# Patient Record
Sex: Male | Born: 1945 | Race: White | Hispanic: No | Marital: Married | Smoking: Current every day smoker
Health system: Southern US, Community
[De-identification: ages and names within clinical notes are randomized; demographics above are authoritative.]

## PROBLEM LIST (undated history)

## (undated) DIAGNOSIS — I10 Essential (primary) hypertension: Secondary | ICD-10-CM

## (undated) DIAGNOSIS — G4730 Sleep apnea, unspecified: Secondary | ICD-10-CM

## (undated) DIAGNOSIS — M109 Gout, unspecified: Secondary | ICD-10-CM

## (undated) DIAGNOSIS — G5790 Unspecified mononeuropathy of unspecified lower limb: Secondary | ICD-10-CM

## (undated) DIAGNOSIS — I82409 Acute embolism and thrombosis of unspecified deep veins of unspecified lower extremity: Secondary | ICD-10-CM

## (undated) DIAGNOSIS — R600 Localized edema: Secondary | ICD-10-CM

## (undated) DIAGNOSIS — R0600 Dyspnea, unspecified: Secondary | ICD-10-CM

## (undated) DIAGNOSIS — E119 Type 2 diabetes mellitus without complications: Secondary | ICD-10-CM

## (undated) DIAGNOSIS — M1990 Unspecified osteoarthritis, unspecified site: Secondary | ICD-10-CM

---
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
## 2003-12-11 HISTORY — PX: ANKLE FRACTURE SURGERY: SHX122

## 2008-07-16 ENCOUNTER — Ambulatory Visit: Payer: Self-pay | Admitting: Internal Medicine

## 2008-08-10 ENCOUNTER — Ambulatory Visit: Payer: Self-pay | Admitting: Internal Medicine

## 2008-11-11 ENCOUNTER — Ambulatory Visit: Payer: Self-pay | Admitting: Internal Medicine

## 2010-02-07 ENCOUNTER — Ambulatory Visit: Payer: Self-pay | Admitting: Gastroenterology

## 2015-07-06 DIAGNOSIS — F17200 Nicotine dependence, unspecified, uncomplicated: Secondary | ICD-10-CM | POA: Insufficient documentation

## 2016-01-05 DIAGNOSIS — I10 Essential (primary) hypertension: Secondary | ICD-10-CM | POA: Insufficient documentation

## 2016-04-16 ENCOUNTER — Ambulatory Visit
Admission: RE | Admit: 2016-04-16 | Discharge: 2016-04-16 | Disposition: A | Discharge status: Home / Self Care | Payer: Medicare Other | Source: Ambulatory Visit | Attending: Internal Medicine | Admitting: Internal Medicine

## 2016-04-16 ENCOUNTER — Other Ambulatory Visit: Payer: Self-pay | Admitting: Internal Medicine

## 2016-04-16 DIAGNOSIS — M79662 Pain in left lower leg: Secondary | ICD-10-CM | POA: Diagnosis present

## 2016-04-16 DIAGNOSIS — I82412 Acute embolism and thrombosis of left femoral vein: Secondary | ICD-10-CM | POA: Diagnosis not present

## 2016-04-16 DIAGNOSIS — M7989 Other specified soft tissue disorders: Secondary | ICD-10-CM | POA: Diagnosis present

## 2016-04-16 DIAGNOSIS — I82432 Acute embolism and thrombosis of left popliteal vein: Secondary | ICD-10-CM | POA: Diagnosis not present

## 2017-05-28 DIAGNOSIS — Z8739 Personal history of other diseases of the musculoskeletal system and connective tissue: Secondary | ICD-10-CM | POA: Insufficient documentation

## 2017-06-13 ENCOUNTER — Other Ambulatory Visit: Payer: Self-pay | Admitting: Internal Medicine

## 2017-06-13 ENCOUNTER — Ambulatory Visit
Admission: RE | Admit: 2017-06-13 | Discharge: 2017-06-13 | Disposition: A | Discharge status: Home / Self Care | Payer: Medicare Other | Source: Ambulatory Visit | Attending: Internal Medicine | Admitting: Internal Medicine

## 2017-06-13 DIAGNOSIS — F17200 Nicotine dependence, unspecified, uncomplicated: Secondary | ICD-10-CM

## 2017-06-13 DIAGNOSIS — E119 Type 2 diabetes mellitus without complications: Secondary | ICD-10-CM | POA: Diagnosis present

## 2017-06-13 DIAGNOSIS — I82412 Acute embolism and thrombosis of left femoral vein: Secondary | ICD-10-CM | POA: Diagnosis not present

## 2017-06-13 DIAGNOSIS — M7989 Other specified soft tissue disorders: Secondary | ICD-10-CM | POA: Diagnosis present

## 2018-04-01 DIAGNOSIS — Z86718 Personal history of other venous thrombosis and embolism: Secondary | ICD-10-CM | POA: Insufficient documentation

## 2018-10-29 NOTE — Progress Notes (Signed)
10/30/2018  11:31 AM   Derrick Morales May 03, 1946 [id number]  Referring provider: Tracie Harrier, MD [address] [facility] [place], [place] [address]  Chief Complaint  Patient presents with  . Hematuria    New Patient    HPI: Derrick Morales is a 72 yo M who presents today for management and evaluation of microscopic hematuria.  He was referred to [place] by Tracie Harrier, MD.   Routine UA showed multiple incedences of microscopic blood in urine, 4-10 RBC on 4/19 and 10/19 with the absence of infection. Also seen on occasion, 2017.   Pt denies gross hematuria, kidney stones, flank pain, and reports of being a current smoker with about 1 pack a day for 50 years. Pt is on chronic Xarelto.   Pt also has a hard time starting a urinating stream in the morning which has been going on for about a year. He denies any other significant voiding symptoms.  This has become increasingly bothersome to him.  PSA 0.39 from 4/19.   PMH: No past medical history on file.  Surgical History: Past Surgical History:  Procedure Laterality Date  . ANKLE FRACTURE SURGERY  2005  . Testicular Torsion Repair Left 1963    Home Medications:  Allergies as of 10/30/2018   No Known Allergies     Medication List        Accurate as of 10/30/18 11:31 AM. Always use your most recent med list.          allopurinol 100 MG tablet Commonly known as:  ZYLOPRIM   furosemide 20 MG tablet Commonly known as:  LASIX   hydrochlorothiazide 25 MG tablet Commonly known as:  HYDRODIURIL   lisinopril 20 MG tablet Commonly known as:  PRINIVIL,ZESTRIL   lovastatin 20 MG tablet Commonly known as:  MEVACOR   metFORMIN 500 MG tablet Commonly known as:  GLUCOPHAGE TAKE 1 TABLET TWICE DAILY   XARELTO 20 MG Tabs tablet Generic drug:  rivaroxaban       Allergies: No Known Allergies  Family History: Family History  Problem Relation Age of Onset  . Kidney cancer Neg Hx   .  Prostate cancer Neg Hx     Social History:  reports that he has been smoking cigarettes. He has been smoking about 1.00 pack per day. He has never used smokeless tobacco. He reports that he drinks alcohol. He reports that he does not use drugs.  ROS: UROLOGY Frequent Urination?: Yes Hard to postpone urination?: No Burning/pain with urination?: No Get up at night to urinate?: Yes Leakage of urine?: No Urine stream starts and stops?: No Trouble starting stream?: Yes Do you have to strain to urinate?: No Blood in urine?: Yes Urinary tract infection?: No Sexually transmitted disease?: No Injury to kidneys or bladder?: No Painful intercourse?: No Weak stream?: Yes Erection problems?: No Penile pain?: No  Gastrointestinal Nausea?: No Vomiting?: No Indigestion/heartburn?: No Diarrhea?: No Constipation?: No  Constitutional Fever: No Night sweats?: No Weight loss?: No Fatigue?: No  Skin Skin rash/lesions?: No Itching?: Yes  Eyes Blurred vision?: Yes Double vision?: Yes  Ears/Nose/Throat Sore throat?: No Sinus problems?: No  Hematologic/Lymphatic Swollen glands?: No Easy bruising?: Yes  Cardiovascular Leg swelling?: Yes Chest pain?: No  Respiratory Cough?: No Shortness of breath?: No  Endocrine Excessive thirst?: No  Musculoskeletal Back pain?: Yes Joint pain?: No  Neurological Headaches?: No Dizziness?: No  Psychologic Depression?: No Anxiety?: No  Physical Exam: BP 110/70   Pulse (!) 101   Ht  5\' 8"  (1.727 m)   Wt 225 lb (102.1 kg)   BMI 34.21 kg/m   Constitutional:  Alert and oriented, No acute distress. HEENT: [place] AT, moist mucus membranes.  Trachea midline, no masses. Cardiovascular: No clubbing, cyanosis, or edema. Respiratory: Normal respiratory effort, no increased work of breathing. GI: Abdomen is soft, nontender, nondistended, no abdominal masses, protuberant abdomnen GU: No CVA tenderness Skin: No rashes, bruises or suspicious  lesions. Neurologic: Grossly intact, no focal deficits, moving all 4 extremities. Psychiatric: Normal mood and affect.  Laboratory Data: Cr 1.2 09/2018 PSA as above  Urinalysis UA negative, see care every for further previous UA Reviewed today  Pertinent Imaging: n/a  Assessment & Plan:   1. Microscopic Hematuria We discussed the differential diagnosis for microscopic hematuria including nephrolithiasis, renal or upper tract tumors, bladder stones, UTIs, or bladder tumors as well as undetermined etiologies. Per AUA guidelines, I did recommend complete microscopic hematuria evaluation including CTU, possible urine cytology, and office cystoscopy. Return in about 4 weeks for CT scan/ f/u CT scan  2. BPH with urinary hesitancy  Trial of Flomax  Discussed possible side effects Reevaluate symptoms at follow   Return in about 4 weeks (around 11/27/2018) for CT scan/ f/u Ct scan.  Hollice Espy, MD  [facility] [address] [place], [place] [address] [phone]  I, Lucas Mallow, am acting as a scribe for Dr. Hollice Espy,  I have reviewed the above documentation for accuracy and completeness, and I agree with the above.   Hollice Espy, MD

## 2018-10-30 ENCOUNTER — Ambulatory Visit (INDEPENDENT_AMBULATORY_CARE_PROVIDER_SITE_OTHER): Payer: Medicare Other | Admitting: Urology

## 2018-10-30 ENCOUNTER — Encounter: Payer: Self-pay | Admitting: Urology

## 2018-10-30 VITALS — BP 110/70 | HR 101 | Ht 68.0 in | Wt 225.0 lb

## 2018-10-30 DIAGNOSIS — N401 Enlarged prostate with lower urinary tract symptoms: Secondary | ICD-10-CM

## 2018-10-30 DIAGNOSIS — N138 Other obstructive and reflux uropathy: Secondary | ICD-10-CM

## 2018-10-30 DIAGNOSIS — G4730 Sleep apnea, unspecified: Secondary | ICD-10-CM | POA: Insufficient documentation

## 2018-10-30 DIAGNOSIS — R3129 Other microscopic hematuria: Secondary | ICD-10-CM | POA: Diagnosis not present

## 2018-10-30 DIAGNOSIS — R3911 Hesitancy of micturition: Secondary | ICD-10-CM | POA: Diagnosis not present

## 2018-10-30 DIAGNOSIS — E785 Hyperlipidemia, unspecified: Secondary | ICD-10-CM | POA: Insufficient documentation

## 2018-10-30 DIAGNOSIS — E119 Type 2 diabetes mellitus without complications: Secondary | ICD-10-CM | POA: Insufficient documentation

## 2018-10-30 LAB — URINALYSIS, COMPLETE
BILIRUBIN UA: NEGATIVE
GLUCOSE, UA: NEGATIVE
Ketones, UA: NEGATIVE
LEUKOCYTES UA: NEGATIVE
Nitrite, UA: NEGATIVE
PROTEIN UA: NEGATIVE
Specific Gravity, UA: 1.015 (ref 1.005–1.030)
UUROB: 0.2 mg/dL (ref 0.2–1.0)
pH, UA: 6 (ref 5.0–7.5)

## 2018-10-30 LAB — MICROSCOPIC EXAMINATION
EPITHELIAL CELLS (NON RENAL): NONE SEEN /HPF (ref 0–10)
WBC UA: NONE SEEN /HPF (ref 0–5)

## 2018-11-14 ENCOUNTER — Ambulatory Visit: Payer: Medicare Other

## 2018-11-19 ENCOUNTER — Ambulatory Visit
Admission: RE | Admit: 2018-11-19 | Discharge: 2018-11-19 | Disposition: A | Discharge status: Home / Self Care | Payer: Medicare Other | Source: Ambulatory Visit | Attending: Urology | Admitting: Urology

## 2018-11-19 DIAGNOSIS — R3129 Other microscopic hematuria: Secondary | ICD-10-CM | POA: Diagnosis not present

## 2018-11-19 HISTORY — DX: Essential (primary) hypertension: I10

## 2018-11-19 LAB — POCT I-STAT CREATININE: Creatinine, Ser: 1.1 mg/dL (ref 0.61–1.24)

## 2018-11-19 MED ORDER — IOPAMIDOL (ISOVUE-300) INJECTION 61%
125.0000 mL | Freq: Once | INTRAVENOUS | Status: AC | PRN
  Administered 2018-11-19: 125 mL via INTRAVENOUS

## 2018-11-19 NOTE — Progress Notes (Signed)
   11/20/2018  CC:  Chief Complaint  Patient presents with  . Results    HPI: Derrick Morales is a 72 yo M who presents today for a cystoscopy for the management and evaluation of microscopic hematuria.   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    Blood pressure 128/76, pulse 84, weight 223 lb (101.2 kg). NED. A&Ox3.   No respiratory distress   Abd soft, NT, ND Normal phallus with bilateral descended testicles  Cystoscopy Procedure Note  Patient identification was confirmed, informed consent was obtained, and patient was prepped using Betadine solution.  Lidocaine jelly was administered per urethral meatus.    Pre-Procedure: - Inspection reveals a normal caliber ureteral meatus.  Procedure: The flexible cystoscope was introduced without difficulty - No urethral strictures/lesions are present. - prostate is mildly enlarge  - Normal bladder neck - Bilateral ureteral orifices identified - Bladder mucosa  reveals no ulcers, tumors, or lesions - No bladder stones - No trabeculation -Superficial 4-5 small tumors measuring 0.14mm each round and papillary in nature in erythematous right posterior bladder wall   Retroflexion is unremarkable.   Post-Procedure: - Patient tolerated the procedure well  Assessment/ Plan: 1. Bladder Tumor  Cystoscopy showed superficial 4-5 small tumors measuring 0.49mm each round and papillary in nature in erythematous right posterior bladder wall  Findings reviewed with the patient Recommend proceeding to the operating room for cystoscopy, TURBT and instillation of intravesical chemotherapy Risk and benefits were discussed in detail including risk of bleeding, infection, damage surrounding structures amongst others.  All questions answered.  2. Microscopic Hematuria CT shows no renal calculi or focal urinary tract lesion  Cysto as above  3. 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 BPH with urinary hesitancy  Pt did not obtain prescription for Flomax; Flomax was re-prescribed today  Discussed possible side effects Reevaluate symptoms at follow up   Dolores Frame, am acting as a scribe for Dr. Hollice Espy,  I have reviewed the above documentation for accuracy and completeness, and I agree with the above.   Hollice Espy, MD

## 2018-11-20 ENCOUNTER — Telehealth: Payer: Self-pay | Admitting: Radiology

## 2018-11-20 ENCOUNTER — Other Ambulatory Visit: Payer: Self-pay | Admitting: Radiology

## 2018-11-20 ENCOUNTER — Ambulatory Visit (INDEPENDENT_AMBULATORY_CARE_PROVIDER_SITE_OTHER): Payer: Medicare Other | Admitting: Urology

## 2018-11-20 ENCOUNTER — Other Ambulatory Visit: Payer: Self-pay

## 2018-11-20 ENCOUNTER — Encounter: Payer: Self-pay | Admitting: Urology

## 2018-11-20 VITALS — BP 128/76 | HR 84 | Wt 223.0 lb

## 2018-11-20 DIAGNOSIS — N138 Other obstructive and reflux uropathy: Secondary | ICD-10-CM

## 2018-11-20 DIAGNOSIS — R3129 Other microscopic hematuria: Secondary | ICD-10-CM | POA: Diagnosis not present

## 2018-11-20 DIAGNOSIS — N401 Enlarged prostate with lower urinary tract symptoms: Secondary | ICD-10-CM

## 2018-11-20 DIAGNOSIS — D494 Neoplasm of unspecified behavior of bladder: Secondary | ICD-10-CM

## 2018-11-20 LAB — URINALYSIS, COMPLETE
BILIRUBIN UA: NEGATIVE
Glucose, UA: NEGATIVE
Ketones, UA: NEGATIVE
Leukocytes, UA: NEGATIVE
Nitrite, UA: NEGATIVE
Protein, UA: NEGATIVE
Specific Gravity, UA: 1.025 (ref 1.005–1.030)
Urobilinogen, Ur: 0.2 mg/dL (ref 0.2–1.0)
pH, UA: 5.5 (ref 5.0–7.5)

## 2018-11-20 LAB — MICROSCOPIC EXAMINATION
Epithelial Cells (non renal): NONE SEEN /hpf (ref 0–10)
WBC, UA: NONE SEEN /hpf (ref 0–5)

## 2018-11-20 MED ORDER — SODIUM CHLORIDE 0.9 % IR SOLN: 2000.0000 mg | Freq: Once | Status: DC

## 2018-11-20 MED ORDER — TAMSULOSIN HCL 0.4 MG PO CAPS: 0.4000 mg | ORAL_CAPSULE | Freq: Every day | ORAL | 11 refills | Status: DC

## 2018-11-20 NOTE — Telephone Encounter (Signed)
Patient was given the [facility] Information form below as well as the Instructions for Pre-Admission Testing form & a map of [facility].   [place], [place], [place] [address] Telephone: [phone] Fax: [phone]   Thank you for choosing [facility] for your upcoming surgery!  We are always here to assist in your urological needs.  Please read the following information with specific details for your upcoming appointments related to your surgery. Please contact Charese Abundis at [phone] Option 3 with any questions.  The Name of Your Surgery: transurethral resection of bladder tumor Your Surgery Date: 12/22/2018 Your Surgeon: Hollice Espy  Please call Same Day Surgery at [phone] between the hours of 1pm-3pm one day prior to your surgery. They will inform you of the time to arrive at Same Day Surgery which is located on the second floor of the [facility]   Please refer to the attached letter regarding instructions for Pre-Admission Testing. You will receive a call from the [place] office regarding your appointment with them.  The Pre-Admission Testing office is located at [place], on the first floor of the [place] [facility] in [place] (office is to the right as you enter through the [employer] of the [facility]). Please have all medications you are currently taking and your insurance card available.   Patient was advised to have nothing to eat or drink after midnight the night prior to surgery except that he may have only water until 2 hours before surgery with nothing to drink within 2 hours of surgery.  The patient states he currently takes Xarelto & was informed that he will be contacted once clearance has been obtained from Dr Ginette Pitman to stop medication prior to surgery. Patient's questions were  answered and he expressed understanding of these instructions.

## 2018-11-21 ENCOUNTER — Other Ambulatory Visit: Payer: Self-pay | Admitting: Family Medicine

## 2018-11-21 MED ORDER — TAMSULOSIN HCL 0.4 MG PO CAPS: 0.4000 mg | ORAL_CAPSULE | Freq: Every day | ORAL | 11 refills | Status: DC

## 2018-11-23 LAB — CULTURE, URINE COMPREHENSIVE

## 2018-11-24 NOTE — Telephone Encounter (Signed)
Advised patient to hold Xarelto x 3 days prior to surgery, beginning on 12/19/2018 per clearance obtained from Dr Ginette Pitman on 11/21/2018. Questions answered. Patient expresses understanding of conversation.

## 2018-11-26 ENCOUNTER — Encounter: Payer: Self-pay | Admitting: Urology

## 2018-11-28 ENCOUNTER — Telehealth: Payer: Self-pay | Admitting: Urology

## 2018-11-28 NOTE — Telephone Encounter (Signed)
Lowella Dandy from [facility] called about pt's Tamsulosin RX requested on 11/20/18.    Please call [phone] or Fax# [phone]

## 2018-11-28 NOTE — Telephone Encounter (Signed)
Spoke to [employer], RX was sent for 90 with 3 refills

## 2018-12-08 ENCOUNTER — Other Ambulatory Visit: Payer: Medicare Other

## 2018-12-15 ENCOUNTER — Encounter
Admission: RE | Admit: 2018-12-15 | Discharge: 2018-12-15 | Disposition: A | Discharge status: Home / Self Care | Payer: Medicare Other | Source: Ambulatory Visit | Attending: Urology | Admitting: Urology

## 2018-12-15 ENCOUNTER — Other Ambulatory Visit: Payer: Self-pay

## 2018-12-15 DIAGNOSIS — Z01818 Encounter for other preprocedural examination: Secondary | ICD-10-CM | POA: Insufficient documentation

## 2018-12-15 HISTORY — DX: Gout, unspecified: M10.9

## 2018-12-15 HISTORY — DX: Localized edema: R60.0

## 2018-12-15 HISTORY — DX: Type 2 diabetes mellitus without complications: E11.9

## 2018-12-15 LAB — URINALYSIS, ROUTINE W REFLEX MICROSCOPIC
Bacteria, UA: NONE SEEN
Bilirubin Urine: NEGATIVE
Glucose, UA: NEGATIVE mg/dL
Hgb urine dipstick: NEGATIVE
Ketones, ur: NEGATIVE mg/dL
Leukocytes, UA: NEGATIVE
Nitrite: NEGATIVE
PH: 7 (ref 5.0–8.0)
Protein, ur: NEGATIVE mg/dL
SQUAMOUS EPITHELIAL / LPF: NONE SEEN (ref 0–5)
Specific Gravity, Urine: 1.01 (ref 1.005–1.030)

## 2018-12-15 NOTE — Pre-Procedure Instructions (Signed)
EKG OK BY DR AMY PENWARDEN

## 2018-12-15 NOTE — Patient Instructions (Signed)
Your procedure is scheduled on: 12/22/17 Mon Report to Same Day Surgery 2nd floor medical mall [facility] Entrance-take elevator on left to 2nd floor.  Check in with surgery information desk.) To find out your arrival time please call [phone] between 1PM - 3PM on 12/19/17 Fri  Remember: Instructions that are not followed completely may result in serious medical risk, up to and including death, or upon the discretion of your surgeon and anesthesiologist your surgery may need to be rescheduled.    _x___ 1. Do not eat food after midnight the night before your procedure. You may drink clear liquids up to 2 hours before you are scheduled to arrive at the hospital for your procedure.  Do not drink clear liquids within 2 hours of your scheduled arrival to the hospital.  Clear liquids include  --Water or Apple juice without pulp  --Clear carbohydrate beverage such as ClearFast or Gatorade  --Black Coffee or Clear Tea (No milk, no creamers, do not add anything to                  the coffee or Tea Type 1 and type 2 diabetics should only drink water.   ____Ensure clear carbohydrate drink on the way to the hospital for bariatric patients  ____Ensure clear carbohydrate drink 3 hours before surgery for Dr Dwyane Luo patients if physician instructed.   No gum chewing or hard candies.     __x__ 2. No Alcohol for 24 hours before or after surgery.   __x__3. No Smoking or e-cigarettes for 24 prior to surgery.  Do not use any chewable tobacco products for at least 6 hour prior to surgery   ____  4. Bring all medications with you on the day of surgery if instructed.    __x__ 5. Notify your doctor if there is any change in your medical condition     (cold, fever, infections).    x___6. On the morning of surgery brush your teeth with toothpaste and water.  You may rinse your mouth with mouth wash if you wish.  Do not swallow any toothpaste or mouthwash.   Do not wear jewelry, make-up, hairpins,  clips or nail polish.  Do not wear lotions, powders, or perfumes. You may wear deodorant.  Do not shave 48 hours prior to surgery. Men may shave face and neck.  Do not bring valuables to the hospital.    [facility] is not responsible for any belongings or valuables.               Contacts, dentures or bridgework may not be worn into surgery.  Leave your suitcase in the car. After surgery it may be brought to your room.  For patients admitted to the hospital, discharge time is determined by your                       treatment team.  _  Patients discharged the day of surgery will not be allowed to drive home.  You will need someone to drive you home and stay with you the night of your procedure.    Please read over the following fact sheets that you were given:   [facility] Preparing for Surgery and or MRSA Information   _x___ Take anti-hypertensive listed below, cardiac, seizure, asthma,     anti-reflux and psychiatric medicines. These include:  1. lovastatin (MEVACOR) 20 MG tablet  2.  3.  4.  5.  6.  ____Fleets enema or  Magnesium Citrate as directed.   _x___ Use CHG Soap or sage wipes as directed on instruction sheet   ____ Use inhalers on the day of surgery and bring to hospital day of surgery  _x___ Stop Metformin and Janumet 2 days prior to surgery.    ____ Take 1/2 of usual insulin dose the night before surgery and none on the morning     surgery.   _x___ Follow recommendations from Cardiologist, Pulmonologist or PCP regarding          stopping Aspirin, Coumadin, Plavix ,Eliquis, Effient, or Pradaxa, and Pletal.  X____Stop Anti-inflammatories such as Advil, Aleve, Ibuprofen, Motrin, Naproxen, Naprosyn, Goodies powders or aspirin products. OK to take Tylenol and                          Celebrex.   _x___ Stop supplements until after surgery.  But may continue Vitamin D, Vitamin B,       and multivitamin.   ____ Bring C-Pap to the hospital.

## 2018-12-15 NOTE — Pre-Procedure Instructions (Signed)
Abnormal EKG shown to anesthesia nurse.

## 2018-12-16 LAB — URINE CULTURE: Culture: NO GROWTH

## 2018-12-17 ENCOUNTER — Encounter: Payer: Self-pay | Admitting: Urology

## 2018-12-21 MED ORDER — CEFAZOLIN SODIUM-DEXTROSE 2-4 GM/100ML-% IV SOLN
2.0000 g | INTRAVENOUS | Status: AC
  Administered 2018-12-22: 2 g via INTRAVENOUS

## 2018-12-22 ENCOUNTER — Ambulatory Visit: Payer: Medicare Other | Admitting: Anesthesiology

## 2018-12-22 ENCOUNTER — Ambulatory Visit
Admission: RE | Admit: 2018-12-22 | Discharge: 2018-12-22 | Disposition: A | Discharge status: Home / Self Care | Payer: Medicare Other | Attending: Urology | Admitting: Urology

## 2018-12-22 ENCOUNTER — Encounter
Admission: RE | Disposition: A | Discharge status: Home / Self Care | Payer: Self-pay | Source: Home / Self Care | Attending: Urology

## 2018-12-22 ENCOUNTER — Other Ambulatory Visit: Payer: Self-pay

## 2018-12-22 DIAGNOSIS — N281 Cyst of kidney, acquired: Secondary | ICD-10-CM | POA: Insufficient documentation

## 2018-12-22 DIAGNOSIS — Z79899 Other long term (current) drug therapy: Secondary | ICD-10-CM | POA: Insufficient documentation

## 2018-12-22 DIAGNOSIS — N401 Enlarged prostate with lower urinary tract symptoms: Secondary | ICD-10-CM | POA: Insufficient documentation

## 2018-12-22 DIAGNOSIS — R3911 Hesitancy of micturition: Secondary | ICD-10-CM | POA: Insufficient documentation

## 2018-12-22 DIAGNOSIS — D494 Neoplasm of unspecified behavior of bladder: Secondary | ICD-10-CM | POA: Diagnosis not present

## 2018-12-22 DIAGNOSIS — D414 Neoplasm of uncertain behavior of bladder: Secondary | ICD-10-CM | POA: Insufficient documentation

## 2018-12-22 DIAGNOSIS — R3129 Other microscopic hematuria: Secondary | ICD-10-CM | POA: Diagnosis not present

## 2018-12-22 HISTORY — PX: TRANSURETHRAL RESECTION OF BLADDER TUMOR WITH MITOMYCIN-C: SHX6459

## 2018-12-22 LAB — GLUCOSE, CAPILLARY
Glucose-Capillary: 145 mg/dL — ABNORMAL HIGH (ref 70–99)
Glucose-Capillary: 144 mg/dL — ABNORMAL HIGH (ref 70–99)

## 2018-12-22 SURGERY — TRANSURETHRAL RESECTION OF BLADDER TUMOR WITH MITOMYCIN-C
Anesthesia: General

## 2018-12-22 MED ORDER — OXYBUTYNIN CHLORIDE 5 MG PO TABS: 5.0000 mg | ORAL_TABLET | Freq: Three times a day (TID) | ORAL | 0 refills | Status: DC | PRN

## 2018-12-22 MED ORDER — GEMCITABINE CHEMO FOR BLADDER INSTILLATION 2000 MG
2000.0000 mg | Freq: Once | INTRAVENOUS | Status: AC
  Administered 2018-12-22: 2000 mg via INTRAVESICAL
  Filled 2018-12-22: qty 52.6

## 2018-12-22 MED ORDER — FENTANYL CITRATE (PF) 100 MCG/2ML IJ SOLN
INTRAMUSCULAR | Status: AC
  Filled 2018-12-22: qty 2

## 2018-12-22 MED ORDER — PHENYLEPHRINE HCL 10 MG/ML IJ SOLN
INTRAMUSCULAR | Status: DC | PRN
  Administered 2018-12-22: 100 ug via INTRAVENOUS

## 2018-12-22 MED ORDER — LACTATED RINGERS IV SOLN
INTRAVENOUS | Status: DC | PRN
  Administered 2018-12-22: 10:00:00 via INTRAVENOUS

## 2018-12-22 MED ORDER — HYDROCODONE-ACETAMINOPHEN 5-325 MG PO TABS: 1.0000 | ORAL_TABLET | Freq: Four times a day (QID) | ORAL | 0 refills | Status: AC | PRN

## 2018-12-22 MED ORDER — MIDAZOLAM HCL 2 MG/2ML IJ SOLN
INTRAMUSCULAR | Status: AC
  Filled 2018-12-22: qty 2

## 2018-12-22 MED ORDER — PHENYLEPHRINE HCL 10 MG/ML IJ SOLN
INTRAMUSCULAR | Status: AC
  Filled 2018-12-22: qty 1

## 2018-12-22 MED ORDER — FENTANYL CITRATE (PF) 100 MCG/2ML IJ SOLN: 25.0000 ug | INTRAMUSCULAR | Status: DC | PRN

## 2018-12-22 MED ORDER — PROPOFOL 10 MG/ML IV BOLUS
INTRAVENOUS | Status: DC | PRN
  Administered 2018-12-22: 140 mg via INTRAVENOUS

## 2018-12-22 MED ORDER — ONDANSETRON HCL 4 MG/2ML IJ SOLN
INTRAMUSCULAR | Status: DC | PRN
  Administered 2018-12-22: 4 mg via INTRAVENOUS

## 2018-12-22 MED ORDER — ROCURONIUM BROMIDE 50 MG/5ML IV SOLN
INTRAVENOUS | Status: AC
  Filled 2018-12-22: qty 1

## 2018-12-22 MED ORDER — LIDOCAINE HCL (CARDIAC) PF 100 MG/5ML IV SOSY
PREFILLED_SYRINGE | INTRAVENOUS | Status: DC | PRN
  Administered 2018-12-22: 80 mg via INTRAVENOUS

## 2018-12-22 MED ORDER — FENTANYL CITRATE (PF) 100 MCG/2ML IJ SOLN
INTRAMUSCULAR | Status: DC | PRN
  Administered 2018-12-22 (×2): 50 ug via INTRAVENOUS

## 2018-12-22 MED ORDER — FAMOTIDINE 20 MG PO TABS
ORAL_TABLET | ORAL | Status: AC
  Filled 2018-12-22: qty 1

## 2018-12-22 MED ORDER — PROPOFOL 10 MG/ML IV BOLUS
INTRAVENOUS | Status: AC
  Filled 2018-12-22: qty 20

## 2018-12-22 MED ORDER — OXYBUTYNIN CHLORIDE 5 MG PO TABS
ORAL_TABLET | ORAL | Status: AC
  Administered 2018-12-22: 5 mg
  Filled 2018-12-22: qty 1

## 2018-12-22 MED ORDER — FAMOTIDINE 20 MG PO TABS
ORAL_TABLET | ORAL | Status: AC
  Administered 2018-12-22: 20 mg via ORAL
  Filled 2018-12-22: qty 1

## 2018-12-22 MED ORDER — SODIUM CHLORIDE 0.9 % IV SOLN
INTRAVENOUS | Status: DC
  Administered 2018-12-22: 09:00:00 via INTRAVENOUS

## 2018-12-22 MED ORDER — FAMOTIDINE 20 MG PO TABS
20.0000 mg | ORAL_TABLET | Freq: Once | ORAL | Status: AC
  Administered 2018-12-22: 20 mg via ORAL

## 2018-12-22 MED ORDER — CEFAZOLIN SODIUM-DEXTROSE 2-4 GM/100ML-% IV SOLN
INTRAVENOUS | Status: AC
  Filled 2018-12-22: qty 100

## 2018-12-22 MED ORDER — HYDROMORPHONE HCL 1 MG/ML IJ SOLN
INTRAMUSCULAR | Status: AC
  Filled 2018-12-22: qty 1

## 2018-12-22 MED ORDER — DEXAMETHASONE SODIUM PHOSPHATE 10 MG/ML IJ SOLN
INTRAMUSCULAR | Status: DC | PRN
  Administered 2018-12-22: 5 mg via INTRAVENOUS

## 2018-12-22 MED ORDER — ONDANSETRON HCL 4 MG/2ML IJ SOLN: 4.0000 mg | Freq: Once | INTRAMUSCULAR | Status: DC | PRN

## 2018-12-22 SURGICAL SUPPLY — 27 items
BAG DRAIN CYSTO-URO LG1000N (MISCELLANEOUS) ×3 IMPLANT
BAG URINE DRAINAGE (UROLOGICAL SUPPLIES) ×3 IMPLANT
BRUSH SCRUB EZ  4% CHG (MISCELLANEOUS) ×2
BRUSH SCRUB EZ 4% CHG (MISCELLANEOUS) ×1 IMPLANT
CATH FOLEY 2WAY  5CC 16FR (CATHETERS) ×2
CATH URTH 16FR FL 2W BLN LF (CATHETERS) ×1 IMPLANT
COVER WAND RF STERILE (DRAPES) ×3 IMPLANT
CRADLE LAMINECT ARM (MISCELLANEOUS) ×3 IMPLANT
DRAPE UTILITY 15X26 TOWEL STRL (DRAPES) ×3 IMPLANT
DRSG TELFA 4X3 1S NADH ST (GAUZE/BANDAGES/DRESSINGS) ×3 IMPLANT
ELECT LOOP 22F BIPOLAR SML (ELECTROSURGICAL)
ELECT REM PT RETURN 9FT ADLT (ELECTROSURGICAL) ×3
ELECTRODE LOOP 22F BIPOLAR SML (ELECTROSURGICAL) IMPLANT
ELECTRODE REM PT RTRN 9FT ADLT (ELECTROSURGICAL) ×1 IMPLANT
GLOVE BIO SURGEON STRL SZ 6.5 (GLOVE) ×2 IMPLANT
GLOVE BIO SURGEONS STRL SZ 6.5 (GLOVE) ×1
GOWN STRL REUS W/ TWL LRG LVL3 (GOWN DISPOSABLE) ×2 IMPLANT
GOWN STRL REUS W/TWL LRG LVL3 (GOWN DISPOSABLE) ×4
KIT TURNOVER CYSTO (KITS) ×3 IMPLANT
LOOP CUT BIPOLAR 24F LRG (ELECTROSURGICAL) IMPLANT
NDL SAFETY ECLIPSE 18X1.5 (NEEDLE) ×1 IMPLANT
NEEDLE HYPO 18GX1.5 SHARP (NEEDLE) ×2
PACK CYSTO AR (MISCELLANEOUS) ×3 IMPLANT
SET IRRIG Y TYPE TUR BLADDER L (SET/KITS/TRAYS/PACK) ×3 IMPLANT
SURGILUBE 2OZ TUBE FLIPTOP (MISCELLANEOUS) ×3 IMPLANT
SYRINGE IRR TOOMEY STRL 70CC (SYRINGE) ×3 IMPLANT
WATER STERILE IRR 1000ML POUR (IV SOLUTION) ×3 IMPLANT

## 2018-12-22 NOTE — H&P (Signed)
   11/20/2018  --> updated 12/22/2017 without changes RRR CTAB  CC:     Chief Complaint  Patient presents with  . Results    HPI: Derrick Morales is a 73 yo M who presents today for a cystoscopy for the management and evaluation of microscopic hematuria.   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    Blood pressure 128/76, pulse 84, weight 223 lb (101.2 kg). NED. A&Ox3.   No respiratory distress   Abd soft, NT, ND Normal phallus with bilateral descended testicles  Cystoscopy Procedure Note  Patient identification was confirmed, informed consent was obtained, and patient was prepped using Betadine solution.  Lidocaine jelly was administered per urethral meatus.    Pre-Procedure: - Inspection reveals a normal caliber ureteral meatus.  Procedure: The flexible cystoscope was introduced without difficulty - No urethral strictures/lesions are present. - prostate is mildly enlarge  - Normal bladder neck - Bilateral ureteral orifices identified - Bladder mucosa  reveals no ulcers, tumors, or lesions - No bladder stones - No trabeculation -Superficial 4-5 small tumors measuring 0.27mm each round and papillary in nature in erythematous right posterior bladder wall   Retroflexion is unremarkable.   Post-Procedure: - Patient tolerated the procedure well  Assessment/ Plan: 1. Bladder Tumor  Cystoscopy showed superficial 4-5 small tumors measuring 0.65mm each round and papillary in nature in erythematous right posterior bladder wall  Findings reviewed with the patient Recommend proceeding to the operating room for cystoscopy, TURBT and instillation of intravesical chemotherapy Risk and benefits were discussed in detail including risk of bleeding, infection, damage surrounding structures amongst others.  All questions answered.  2. Microscopic Hematuria CT shows no renal calculi or focal urinary tract  lesion  Cysto as above  3. 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 BPH with urinary hesitancy  Pt did not obtain prescription for Flomax; Flomax was re-prescribed today  Discussed possible side effects Reevaluate symptoms at followup   Dolores Frame, am acting as a scribe for Dr. Hollice Espy,  I have reviewed the above documentation for accuracy and completeness, and I agree with the above.   Hollice Espy, MD

## 2018-12-22 NOTE — Transfer of Care (Signed)
Immediate Anesthesia Transfer of Care Note  Patient: Derrick Morales  Procedure(s) Performed: TRANSURETHRAL RESECTION OF BLADDER TUMOR WITH Gemcitabine (N/A )  Patient Location: PACU  Anesthesia Type:General  Level of Consciousness: sedated  Airway & Oxygen Therapy: Patient Spontanous Breathing and Patient connected to face mask oxygen  Post-op Assessment: Report given to RN  Post vital signs: Reviewed and stable  Last Vitals:  Vitals Value Taken Time  BP 150/75 12/22/2018 10:13 AM  Temp    Pulse 77 12/22/2018 10:16 AM  Resp 16 12/22/2018 10:16 AM  SpO2 97 % 12/22/2018 10:16 AM  Vitals shown include unvalidated device data.  Last Pain:  Vitals:   12/22/18 0839  TempSrc: Tympanic  PainSc: 0-No pain         Complications: No apparent anesthesia complications

## 2018-12-22 NOTE — Op Note (Signed)
Date of procedure: 12/22/18  Preoperative diagnosis:  1. Hematuria 2. Bladder tumors  Postoperative diagnosis:  1. Same as above  Procedure: 1. TURBT, small 2. Instillation of intravesical chemotherapy  Surgeon: Hollice Espy, MD  Anesthesia: General  Complications: None  Intraoperative findings: 5 small bladder tumors measuring approximately 5 mm each, superficial papillary in appearance on the right lateral bladder wall.  No additional tumors identified.  EBL: Minimal  Specimens: Right lateral wall bladder tumors  Drains: 16 French Foley catheter  Indication: Derrick Morales is a 73 y.o. patient with hematuria who underwent evaluation including CT urogram with normal upper tracts found to have small bladder tumors on cystoscopy in the office.  He was counseled to undergo TURBT with instillation of intravesical chemotherapy.  After reviewing the management options for treatment, he elected to proceed with the above surgical procedure(s). We have discussed the potential benefits and risks of the procedure, side effects of the proposed treatment, the likelihood of the patient achieving the goals of the procedure, and any potential problems that might occur during the procedure or recuperation. Informed consent has been obtained.  Description of procedure:  The patient was taken to the operating room and general anesthesia was induced.  The patient was placed in the dorsal lithotomy position, prepped and draped in the usual sterile fashion, and preoperative antibiotics were administered. A preoperative time-out was performed.   A 21 French scope was advanced per urethra into the bladder.  The bladder was carefully inspected and approximately 5 small tumors measuring 5 mm each were identified in the right lateral bladder wall on a mildly erythematous base.  The remainder of the bladder was unremarkable without any additional lesions identified, ulcerations, stones, or significant  trabeculation.  The trigone was also normal.  The prostatic fossa was normal.  Attention was turned to the right lateral wall using cold cup biopsy forceps, the tumors were resected and a piece wise fashion until there is no active viable residual tumors remaining.  Bugbee electrocautery was then used to fulgurate the base of the resection bed as well as the periphery around the tumor to ensure that there is no erythematous or viable tumor remaining.  Hemostasis was adequate.  The bladder was then drained.  A 16 French Foley catheter was then placed using 10 cc of water in the balloon.  The patient was then clean and dry, repositioned the supine position, first anesthesia, taken the PACU in stable condition.  2000 mg of intravesical gemcitabine was instilled into the bladder and allowed to dwell in the PACU for 1 hour.  This was well-tolerated.  At the end of the hour, the bladder was then drained and the Foley catheter was removed.  Plan: I will call the patient with his pathology report as I anticipate he will need no further intervention.  We will plan for cystoscopy for surveillance in 3 months.  All questions answered.  Hollice Espy, M.D.

## 2018-12-22 NOTE — Anesthesia Preprocedure Evaluation (Signed)
Anesthesia Evaluation  Patient identified by MRN, date of birth, ID band Patient awake    Reviewed: Allergy & Precautions, H&P , NPO status , Patient's Chart, lab work & pertinent test results, reviewed documented beta blocker date and time   Airway Mallampati: II  TM Distance: >3 FB Neck ROM: full    Dental  (+) Teeth Intact   Pulmonary neg pulmonary ROS, sleep apnea , Current Smoker,    Pulmonary exam normal        Cardiovascular Exercise Tolerance: Poor hypertension, On Medications negative cardio ROS Normal cardiovascular exam Rate:Normal     Neuro/Psych negative neurological ROS  negative psych ROS   GI/Hepatic negative GI ROS, Neg liver ROS,   Endo/Other  negative endocrine ROSdiabetes, Well Controlled  Renal/GU negative Renal ROS  negative genitourinary   Musculoskeletal   Abdominal   Peds  Hematology negative hematology ROS (+)   Anesthesia Other Findings   Reproductive/Obstetrics negative OB ROS                             Anesthesia Physical Anesthesia Plan  ASA: III  Anesthesia Plan: General LMA   Post-op Pain Management:    Induction:   PONV Risk Score and Plan:   Airway Management Planned:   Additional Equipment:   Intra-op Plan:   Post-operative Plan:   Informed Consent: I have reviewed the patients History and Physical, chart, labs and discussed the procedure including the risks, benefits and alternatives for the proposed anesthesia with the patient or authorized representative who has indicated his/her understanding and acceptance.     Plan Discussed with: CRNA  Anesthesia Plan Comments:         Anesthesia Quick Evaluation

## 2018-12-22 NOTE — Discharge Instructions (Addendum)
Transurethral Resection of Bladder Tumor (TURBT) or Bladder Biopsy ° ° °Definition: ° Transurethral Resection of the Bladder Tumor is a surgical procedure used to diagnose and remove tumors within the bladder. TURBT is the most common treatment for early stage bladder cancer. ° °General instructions: °   ° Your recent bladder surgery requires very little post hospital care but some definite precautions. ° °Despite the fact that no skin incisions were used, the area around the bladder incisions are raw and covered with scabs to promote healing and prevent bleeding. Certain precautions are needed to insure that the scabs are not disturbed over the next 2-4 weeks while the healing proceeds. ° °Because the raw surface inside your bladder and the irritating effects of urine you may expect frequency of urination and/or urgency (a stronger desire to urinate) and perhaps even getting up at night more often. This will usually resolve or improve slowly over the healing period. You may see some blood in your urine over the first 6 weeks. Do not be alarmed, even if the urine was clear for a while. Get off your feet and drink lots of fluids until clearing occurs. If you start to pass clots or don't improve call us. ° °Diet: ° °You may return to your normal diet immediately. Because of the raw surface of your bladder, alcohol, spicy foods, foods high in acid and drinks with caffeine may cause irritation or frequency and should be used in moderation. To keep your urine flowing freely and avoid constipation, drink plenty of fluids during the day (8-10 glasses). Tip: Avoid cranberry juice because it is very acidic. ° °Activity: ° °Your physical activity doesn't need to be restricted. However, if you are very active, you may see some blood in the urine. We suggest that you reduce your activity under the circumstances until the bleeding has stopped. ° °Bowels: ° °It is important to keep your bowels regular during the postoperative  period. Straining with bowel movements can cause bleeding. A bowel movement every other day is reasonable. Use a mild laxative if needed, such as milk of magnesia 2-3 tablespoons, or 2 Dulcolax tablets. Call if you continue to have problems. If you had been taking narcotics for pain, before, during or after your surgery, you may be constipated. Take a laxative if necessary. ° ° ° °Medication: ° °You should resume your pre-surgery medications unless told not to. In addition you may be given an antibiotic to prevent or treat infection. Antibiotics are not always necessary. All medication should be taken as prescribed until the bottles are finished unless you are having an unusual reaction to one of the drugs. ° ° °[facility] °[place], [place] [address] °[phone] ° ° ° °AMBULATORY SURGERY  °DISCHARGE INSTRUCTIONS ° ° °1) The drugs that you were given will stay in your system until tomorrow so for the next 24 hours you should not: ° °A) Drive an automobile °B) Make any legal decisions °C) Drink any alcoholic beverage ° ° °2) You may resume regular meals tomorrow.  Today it is better to start with liquids and gradually work up to solid foods. ° °You may eat anything you prefer, but it is better to start with liquids, then soup and crackers, and gradually work up to solid foods. ° ° °3) Please notify your doctor immediately if you have any unusual bleeding, trouble breathing, redness and pain at the surgery site, drainage, fever, or pain not relieved by medication. ° ° ° °4) Additional Instructions: ° ° ° ° ° ° ° °  Please contact your physician with any problems or Same Day Surgery at [phone], Monday through Friday 6 am to 4 pm, or [place] [facility] [place] Main number at [phone]. ° °

## 2018-12-22 NOTE — Anesthesia Procedure Notes (Signed)
Procedure Name: LMA Insertion Date/Time: 12/22/2018 9:46 AM Performed by: Justus Memory, CRNA Pre-anesthesia Checklist: Patient identified, Patient being monitored, Timeout performed, Emergency Drugs available and Suction available Patient Re-evaluated:Patient Re-evaluated prior to induction Oxygen Delivery Method: Circle system utilized Preoxygenation: Pre-oxygenation with 100% oxygen Induction Type: IV induction Ventilation: Mask ventilation without difficulty LMA: LMA inserted LMA Size: 4.5 Tube type: Oral Number of attempts: 1 Placement Confirmation: positive ETCO2 and breath sounds checked- equal and bilateral Tube secured with: Tape Dental Injury: Teeth and Oropharynx as per pre-operative assessment

## 2018-12-22 NOTE — Anesthesia Post-op Follow-up Note (Signed)
Anesthesia QCDR form completed.        

## 2018-12-23 ENCOUNTER — Telehealth: Payer: Self-pay | Admitting: Urology

## 2018-12-23 LAB — SURGICAL PATHOLOGY

## 2018-12-23 NOTE — Telephone Encounter (Signed)
LMOM to return call to discuss pathology.       Hollice Espy, MD

## 2018-12-23 NOTE — Anesthesia Postprocedure Evaluation (Signed)
Anesthesia Post Note  Patient: [name]  Procedure(s) Performed: TRANSURETHRAL RESECTION OF BLADDER TUMOR WITH Gemcitabine (N/A )  Patient location during evaluation: PACU Anesthesia Type: General Level of consciousness: awake and alert Pain management: pain level controlled Vital Signs Assessment: post-procedure vital signs reviewed and stable Respiratory status: spontaneous breathing, nonlabored ventilation, respiratory function stable and patient connected to nasal cannula oxygen Cardiovascular status: blood pressure returned to baseline and stable Postop Assessment: no apparent nausea or vomiting Anesthetic complications: no     Last Vitals:  Vitals:   12/22/18 1102 12/22/18 1149  BP: (!) 123/54 101/86  Pulse: 66 71  Resp: 18 18  Temp: 36.9 C   SpO2: 99% 98%    Last Pain:  Vitals:   12/22/18 1149  TempSrc:   PainSc: 3                  Molli Barrows

## 2018-12-24 ENCOUNTER — Encounter: Payer: Self-pay | Admitting: Urology

## 2018-12-24 NOTE — Telephone Encounter (Signed)
Pt returned your call today and would like for you to call him at your earliest convenience.

## 2018-12-26 NOTE — Telephone Encounter (Signed)
Discussed surgical pathology findings with patient, papillary neoplasm of low malignant potential.  We discussed the pathophysiology and prognosis of this.  We discussed that surveillance for the sorts of lesions is somewhat controversial however I do think it is reasonable to pursue cystoscopy annually for a few years to ensure that there is no recurrence or transformation.  He is agreeable this plan.  All questions answered.  Please reschedule his follow-up for cystoscopy in 1 year, cancel a cystoscopy in 3 months as previously scheduled.

## 2019-01-06 ENCOUNTER — Ambulatory Visit: Payer: Medicare Other | Admitting: Urology

## 2019-03-04 ENCOUNTER — Encounter: Admission: RE | Payer: Self-pay | Source: Home / Self Care

## 2019-03-04 ENCOUNTER — Ambulatory Visit: Admission: RE | Admit: 2019-03-04 | Payer: Medicare Other | Source: Home / Self Care | Admitting: Ophthalmology

## 2019-03-04 SURGERY — PHACOEMULSIFICATION, CATARACT, WITH IOL INSERTION
Anesthesia: Topical | Laterality: Right

## 2019-03-24 ENCOUNTER — Other Ambulatory Visit: Payer: Medicare Other | Admitting: Urology

## 2019-05-21 ENCOUNTER — Encounter: Payer: Self-pay | Admitting: *Deleted

## 2019-05-21 ENCOUNTER — Other Ambulatory Visit: Payer: Self-pay

## 2019-05-21 NOTE — Discharge Instructions (Signed)

## 2019-05-22 ENCOUNTER — Other Ambulatory Visit
Admission: RE | Admit: 2019-05-22 | Discharge: 2019-05-22 | Disposition: A | Discharge status: Home / Self Care | Payer: Medicare Other | Source: Ambulatory Visit | Attending: Ophthalmology | Admitting: Ophthalmology

## 2019-05-22 DIAGNOSIS — Z1159 Encounter for screening for other viral diseases: Secondary | ICD-10-CM | POA: Diagnosis not present

## 2019-05-22 DIAGNOSIS — Z01812 Encounter for preprocedural laboratory examination: Secondary | ICD-10-CM | POA: Insufficient documentation

## 2019-05-23 LAB — NOVEL CORONAVIRUS, NAA (HOSP ORDER, SEND-OUT TO REF LAB; TAT 18-24 HRS): SARS-CoV-2, NAA: NOT DETECTED

## 2019-05-26 DIAGNOSIS — H269 Unspecified cataract: Secondary | ICD-10-CM

## 2019-05-27 ENCOUNTER — Encounter
Admission: RE | Disposition: A | Discharge status: Home / Self Care | Payer: Self-pay | Source: Home / Self Care | Attending: Ophthalmology

## 2019-05-27 ENCOUNTER — Ambulatory Visit
Admission: RE | Admit: 2019-05-27 | Discharge: 2019-05-27 | Disposition: A | Discharge status: Home / Self Care | Payer: Medicare Other | Attending: Ophthalmology | Admitting: Ophthalmology

## 2019-05-27 ENCOUNTER — Other Ambulatory Visit: Payer: Self-pay

## 2019-05-27 ENCOUNTER — Ambulatory Visit: Payer: Medicare Other | Admitting: Anesthesiology

## 2019-05-27 DIAGNOSIS — E1140 Type 2 diabetes mellitus with diabetic neuropathy, unspecified: Secondary | ICD-10-CM | POA: Insufficient documentation

## 2019-05-27 DIAGNOSIS — E7800 Pure hypercholesterolemia, unspecified: Secondary | ICD-10-CM | POA: Diagnosis not present

## 2019-05-27 DIAGNOSIS — M1990 Unspecified osteoarthritis, unspecified site: Secondary | ICD-10-CM | POA: Diagnosis not present

## 2019-05-27 DIAGNOSIS — G4730 Sleep apnea, unspecified: Secondary | ICD-10-CM | POA: Insufficient documentation

## 2019-05-27 DIAGNOSIS — I10 Essential (primary) hypertension: Secondary | ICD-10-CM | POA: Insufficient documentation

## 2019-05-27 DIAGNOSIS — E1136 Type 2 diabetes mellitus with diabetic cataract: Secondary | ICD-10-CM | POA: Diagnosis not present

## 2019-05-27 DIAGNOSIS — H2511 Age-related nuclear cataract, right eye: Secondary | ICD-10-CM | POA: Diagnosis present

## 2019-05-27 DIAGNOSIS — F17200 Nicotine dependence, unspecified, uncomplicated: Secondary | ICD-10-CM | POA: Insufficient documentation

## 2019-05-27 DIAGNOSIS — H269 Unspecified cataract: Secondary | ICD-10-CM

## 2019-05-27 DIAGNOSIS — M109 Gout, unspecified: Secondary | ICD-10-CM | POA: Insufficient documentation

## 2019-05-27 DIAGNOSIS — Z86718 Personal history of other venous thrombosis and embolism: Secondary | ICD-10-CM | POA: Diagnosis not present

## 2019-05-27 DIAGNOSIS — R2243 Localized swelling, mass and lump, lower limb, bilateral: Secondary | ICD-10-CM | POA: Insufficient documentation

## 2019-05-27 HISTORY — DX: Acute embolism and thrombosis of unspecified deep veins of unspecified lower extremity: I82.409

## 2019-05-27 HISTORY — DX: Dyspnea, unspecified: R06.00

## 2019-05-27 HISTORY — PX: CATARACT EXTRACTION W/PHACO: SHX586

## 2019-05-27 HISTORY — DX: Sleep apnea, unspecified: G47.30

## 2019-05-27 HISTORY — DX: Unspecified osteoarthritis, unspecified site: M19.90

## 2019-05-27 HISTORY — DX: Unspecified mononeuropathy of unspecified lower limb: G57.90

## 2019-05-27 LAB — GLUCOSE, CAPILLARY
Glucose-Capillary: 154 mg/dL — ABNORMAL HIGH (ref 70–99)
Glucose-Capillary: 126 mg/dL — ABNORMAL HIGH (ref 70–99)

## 2019-05-27 SURGERY — PHACOEMULSIFICATION, CATARACT, WITH IOL INSERTION
Anesthesia: Monitor Anesthesia Care | Site: Eye | Laterality: Right

## 2019-05-27 MED ORDER — OXYCODONE HCL 5 MG/5ML PO SOLN: 5.0000 mg | Freq: Once | ORAL | Status: DC | PRN

## 2019-05-27 MED ORDER — MOXIFLOXACIN HCL 0.5 % OP SOLN
1.0000 [drp] | OPHTHALMIC | Status: DC | PRN
  Administered 2019-05-27 (×3): 1 [drp] via OPHTHALMIC

## 2019-05-27 MED ORDER — NA HYALUR & NA CHOND-NA HYALUR 0.4-0.35 ML IO KIT
PACK | INTRAOCULAR | Status: DC | PRN
  Administered 2019-05-27: 1 mL via INTRAOCULAR

## 2019-05-27 MED ORDER — FENTANYL CITRATE (PF) 100 MCG/2ML IJ SOLN
INTRAMUSCULAR | Status: DC | PRN
  Administered 2019-05-27: 50 ug via INTRAVENOUS

## 2019-05-27 MED ORDER — ARMC OPHTHALMIC DILATING DROPS
1.0000 "application " | OPHTHALMIC | Status: DC | PRN
  Administered 2019-05-27 (×3): 1 via OPHTHALMIC

## 2019-05-27 MED ORDER — LIDOCAINE HCL (PF) 2 % IJ SOLN
INTRAOCULAR | Status: DC | PRN
  Administered 2019-05-27: 1 mL

## 2019-05-27 MED ORDER — MIDAZOLAM HCL 2 MG/2ML IJ SOLN
INTRAMUSCULAR | Status: DC | PRN
  Administered 2019-05-27 (×2): 1 mg via INTRAVENOUS

## 2019-05-27 MED ORDER — CEFUROXIME OPHTHALMIC INJECTION 1 MG/0.1 ML
INJECTION | OPHTHALMIC | Status: DC | PRN
  Administered 2019-05-27: 0.1 mL via INTRACAMERAL

## 2019-05-27 MED ORDER — TETRACAINE HCL 0.5 % OP SOLN
1.0000 [drp] | OPHTHALMIC | Status: DC | PRN
  Administered 2019-05-27 (×3): 1 [drp] via OPHTHALMIC

## 2019-05-27 MED ORDER — EPINEPHRINE PF 1 MG/ML IJ SOLN
INTRAOCULAR | Status: DC | PRN
  Administered 2019-05-27: 72 mL via OPHTHALMIC

## 2019-05-27 MED ORDER — OXYCODONE HCL 5 MG PO TABS: 5.0000 mg | ORAL_TABLET | Freq: Once | ORAL | Status: DC | PRN

## 2019-05-27 MED ORDER — BRIMONIDINE TARTRATE-TIMOLOL 0.2-0.5 % OP SOLN
OPHTHALMIC | Status: DC | PRN
  Administered 2019-05-27: 1 [drp] via OPHTHALMIC

## 2019-05-27 SURGICAL SUPPLY — 18 items
CANNULA ANT/CHMB 27GA (MISCELLANEOUS) ×3 IMPLANT
GLOVE SURG LX 7.5 STRW (GLOVE) ×2
GLOVE SURG LX STRL 7.5 STRW (GLOVE) ×1 IMPLANT
GLOVE SURG TRIUMPH 8.0 PF LTX (GLOVE) ×3 IMPLANT
GOWN STRL REUS W/ TWL LRG LVL3 (GOWN DISPOSABLE) ×2 IMPLANT
GOWN STRL REUS W/TWL LRG LVL3 (GOWN DISPOSABLE) ×4
LENS IOL TECNIS ITEC 18.5 (Intraocular Lens) ×3 IMPLANT
MARKER SKIN DUAL TIP RULER LAB (MISCELLANEOUS) ×3 IMPLANT
NEEDLE FILTER BLUNT 18X 1/2SAF (NEEDLE) ×2
NEEDLE FILTER BLUNT 18X1 1/2 (NEEDLE) ×1 IMPLANT
PACK CATARACT BRASINGTON (MISCELLANEOUS) ×3 IMPLANT
PACK EYE AFTER SURG (MISCELLANEOUS) ×3 IMPLANT
PACK OPTHALMIC (MISCELLANEOUS) ×3 IMPLANT
SYR 3ML LL SCALE MARK (SYRINGE) ×3 IMPLANT
SYR 5ML LL (SYRINGE) ×3 IMPLANT
SYR TB 1ML LUER SLIP (SYRINGE) ×3 IMPLANT
WATER STERILE IRR 500ML POUR (IV SOLUTION) ×3 IMPLANT
WIPE NON LINTING 3.25X3.25 (MISCELLANEOUS) ×3 IMPLANT

## 2019-05-27 NOTE — Op Note (Signed)
LOCATION:  [facility]   PREOPERATIVE DIAGNOSIS:    Nuclear sclerotic cataract right eye. H25.11   POSTOPERATIVE DIAGNOSIS:  Nuclear sclerotic cataract right eye.     PROCEDURE:  Phacoemusification with posterior chamber intraocular lens placement of the right eye   LENS:   Implant Name Type Inv. Item Serial No. Manufacturer Lot No. LRB No. Used Action  LENS IOL DIOP 18.5 - [id number] Intraocular Lens LENS IOL DIOP 18.5 [id number]  Right 1 Implanted        ULTRASOUND TIME: 13 % of 1 minutes, 21 seconds.  CDE 11.0   SURGEON:  Wyonia Hough, MD   ANESTHESIA:  Topical with tetracaine drops and 2% Xylocaine jelly, augmented with 1% preservative-free intracameral lidocaine.    COMPLICATIONS:  None.   DESCRIPTION OF PROCEDURE:  The patient was identified in the holding room and transported to the operating room and placed in the supine position under the operating microscope.  The right eye was identified as the operative eye and it was prepped and draped in the usual sterile ophthalmic fashion.   A 1 millimeter clear-corneal paracentesis was made at the 12:00 position.  0.5 ml of preservative-free 1% lidocaine was injected into the anterior chamber. The anterior chamber was filled with Viscoat viscoelastic.  A 2.4 millimeter keratome was used to make a near-clear corneal incision at the 9:00 position.  A curvilinear capsulorrhexis was made with a cystotome and capsulorrhexis forceps.  Balanced salt solution was used to hydrodissect and hydrodelineate the nucleus.   Phacoemulsification was then used in stop and chop fashion to remove the lens nucleus and epinucleus.  The remaining cortex was then removed using the irrigation and aspiration handpiece. Provisc was then placed into the capsular bag to distend it for lens placement.  A lens was then injected into the capsular bag.  The remaining viscoelastic was aspirated.   Wounds were hydrated with balanced salt solution.   The anterior chamber was inflated to a physiologic pressure with balanced salt solution.  No wound leaks were noted. Cefuroxime 0.1 ml of a 10mg /ml solution was injected into the anterior chamber for a dose of 1 mg of intracameral antibiotic at the completion of the case.   Timolol and Brimonidine drops were applied to the eye.  The patient was taken to the recovery room in stable condition without complications of anesthesia or surgery.   Derrick Morales 05/27/2019, 7:59 AM

## 2019-05-27 NOTE — Anesthesia Procedure Notes (Signed)
Procedure Name: MAC Performed by: Helios Kohlmann, CRNA Pre-anesthesia Checklist: Patient identified, Emergency Drugs available, Suction available, Timeout performed and Patient being monitored Patient Re-evaluated:Patient Re-evaluated prior to induction Oxygen Delivery Method: Nasal cannula Placement Confirmation: positive ETCO2       

## 2019-05-27 NOTE — Anesthesia Preprocedure Evaluation (Signed)
Anesthesia Evaluation  Patient identified by MRN, date of birth, ID band  Reviewed: NPO status   History of Anesthesia Complications Negative for: history of anesthetic complications  Airway Mallampati: II  TM Distance: >3 FB Neck ROM: full    Dental   Poor dentition:   Pulmonary sleep apnea (no cpap) , Current Smoker,    Pulmonary exam normal        Cardiovascular Exercise Tolerance: Good hypertension, + DVT (legs: 2007)  Normal cardiovascular exam     Neuro/Psych Foot neuropathy negative neurological ROS  negative psych ROS   GI/Hepatic negative GI ROS, Neg liver ROS,   Endo/Other  diabetesMorbid obesity (bmi=34)  Renal/GU negative Renal ROS  negative genitourinary   Musculoskeletal  (+) Arthritis , gout   Abdominal   Peds  Hematology negative hematology ROS (+)   Anesthesia Other Findings Covid: NEG;  Last xarelto: 6/16  Reproductive/Obstetrics                             Anesthesia Physical Anesthesia Plan  ASA: III  Anesthesia Plan: MAC   Post-op Pain Management:    Induction:   PONV Risk Score and Plan:   Airway Management Planned:   Additional Equipment:   Intra-op Plan:   Post-operative Plan:   Informed Consent: I have reviewed the patients History and Physical, chart, labs and discussed the procedure including the risks, benefits and alternatives for the proposed anesthesia with the patient or authorized representative who has indicated his/her understanding and acceptance.       Plan Discussed with: CRNA  Anesthesia Plan Comments:         Anesthesia Quick Evaluation

## 2019-05-27 NOTE — H&P (Signed)

## 2019-05-27 NOTE — Transfer of Care (Signed)
Immediate Anesthesia Transfer of Care Note  Patient: Derrick Morales  Procedure(s) Performed: CATARACT EXTRACTION PHACO AND INTRAOCULAR LENS PLACEMENT (IOC)  RIGHT DIABETIC (Right Eye)  Patient Location: PACU  Anesthesia Type: MAC  Level of Consciousness: awake, alert  and patient cooperative  Airway and Oxygen Therapy: Patient Spontanous Breathing and Patient connected to supplemental oxygen  Post-op Assessment: Post-op Vital signs reviewed, Patient's Cardiovascular Status Stable, Respiratory Function Stable, Patent Airway and No signs of Nausea or vomiting  Post-op Vital Signs: Reviewed and stable  Complications: No apparent anesthesia complications

## 2019-05-27 NOTE — Anesthesia Postprocedure Evaluation (Signed)
Anesthesia Post Note  Patient: Derrick Morales  Procedure(s) Performed: CATARACT EXTRACTION PHACO AND INTRAOCULAR LENS PLACEMENT (IOC)  RIGHT DIABETIC (Right Eye)  Patient location during evaluation: PACU Anesthesia Type: MAC Level of consciousness: awake and alert Pain management: pain level controlled Vital Signs Assessment: post-procedure vital signs reviewed and stable Respiratory status: spontaneous breathing, nonlabored ventilation, respiratory function stable and patient connected to nasal cannula oxygen Cardiovascular status: stable and blood pressure returned to baseline Postop Assessment: no apparent nausea or vomiting Anesthetic complications: no    Daily Crate

## 2019-05-28 ENCOUNTER — Encounter: Payer: Self-pay | Admitting: Ophthalmology

## 2019-12-29 ENCOUNTER — Other Ambulatory Visit: Payer: Medicare Other | Admitting: Urology

## 2021-05-03 ENCOUNTER — Telehealth: Payer: Self-pay | Admitting: *Deleted

## 2021-05-03 NOTE — Telephone Encounter (Signed)
Attempted to contact patient to schedule lung screening scan. However, there is no answer or voicemail option available.

## 2021-05-26 ENCOUNTER — Telehealth: Payer: Self-pay | Admitting: *Deleted

## 2021-05-29 ENCOUNTER — Encounter: Payer: Self-pay | Admitting: *Deleted

## 2021-05-29 ENCOUNTER — Telehealth: Payer: Self-pay | Admitting: *Deleted

## 2021-05-29 NOTE — Telephone Encounter (Signed)
Sent patient a MyChart message re: facilitating the initial lung screening scan.

## 2021-05-29 NOTE — Telephone Encounter (Signed)
Received referral for low dose lung cancer screening CT scan. Was unable to leave a message at the phone number listed in EMR, because the mailbox was full.

## 2021-06-22 ENCOUNTER — Other Ambulatory Visit: Payer: Self-pay | Admitting: *Deleted

## 2021-06-22 DIAGNOSIS — Z87891 Personal history of nicotine dependence: Secondary | ICD-10-CM

## 2021-06-22 DIAGNOSIS — F17210 Nicotine dependence, cigarettes, uncomplicated: Secondary | ICD-10-CM

## 2021-07-25 ENCOUNTER — Encounter: Payer: Self-pay | Admitting: Primary Care

## 2021-07-25 ENCOUNTER — Ambulatory Visit
Admission: RE | Admit: 2021-07-25 | Discharge: 2021-07-25 | Disposition: A | Discharge status: Home / Self Care | Payer: Medicare Other | Source: Ambulatory Visit | Attending: Acute Care | Admitting: Acute Care

## 2021-07-25 ENCOUNTER — Telehealth (INDEPENDENT_AMBULATORY_CARE_PROVIDER_SITE_OTHER): Payer: Medicare Other | Admitting: Primary Care

## 2021-07-25 ENCOUNTER — Other Ambulatory Visit: Payer: Self-pay

## 2021-07-25 VITALS — Ht 68.0 in | Wt 223.0 lb

## 2021-07-25 DIAGNOSIS — Z87891 Personal history of nicotine dependence: Secondary | ICD-10-CM | POA: Insufficient documentation

## 2021-07-25 DIAGNOSIS — F17200 Nicotine dependence, unspecified, uncomplicated: Secondary | ICD-10-CM

## 2021-07-25 DIAGNOSIS — F17210 Nicotine dependence, cigarettes, uncomplicated: Secondary | ICD-10-CM | POA: Diagnosis present

## 2021-07-25 NOTE — Progress Notes (Signed)
I called the patient and it sounds like the phone picks up at 0847 am and the VM is full.  Patient does not have access to a camera on his side.

## 2021-07-25 NOTE — Patient Instructions (Signed)
Thank you for participating in the [facility] Screening Program. It was our pleasure to meet you today. We will call you with the results of your scan within the next few days. Your scan will be assigned a Lung RADS category score by the physicians reading the scans.  This Lung RADS score determines follow up scanning.  See below for description of categories, and follow up screening recommendations. We will be in touch to schedule your follow up screening annually or based on recommendations of our providers. We will fax a copy of your scan results to your Primary Care Physician, or the physician who referred you to the program, to ensure they have the results. Please call the office if you have any questions or concerns regarding your scanning experience or results.  Our office number is [phone]. Please speak with Denise Phelps, RN. She is our Lung Cancer Screening RN. If she is unavailable when you call, please have the office staff send her a message. She will return your call at her earliest convenience. Remember, if your scan is normal, we will scan you annually as long as you continue to meet the criteria for the program. (Age 55-77, Current smoker or smoker who has quit within the last 15 years). If you are a smoker, remember, quitting is the single most powerful action that you can take to decrease your risk of lung cancer and other pulmonary, breathing related problems. We know quitting is hard, and we are here to help.  Please let us know if there is anything we can do to help you meet your goal of quitting. If you are a former smoker, congratulations. We are proud of you! Remain smoke free! Remember you can refer friends or family members through the number above.  We will screen them to make sure they meet criteria for the program. Thank you for helping us take better care of you by participating in Lung Screening.  Lung RADS Categories:  Lung RADS 1: no nodules  or definitely non-concerning nodules.  Recommendation is for a repeat annual scan in 12 months.  Lung RADS 2:  nodules that are non-concerning in appearance and behavior with a very low likelihood of becoming an active cancer. Recommendation is for a repeat annual scan in 12 months.  Lung RADS 3: nodules that are probably non-concerning , includes nodules with a low likelihood of becoming an active cancer.  Recommendation is for a 6-month repeat screening scan. Often noted after an upper respiratory illness. We will be in touch to make sure you have no questions, and to schedule your 6-month scan.  Lung RADS 4 A: nodules with concerning findings, recommendation is most often for a follow up scan in 3 months or additional testing based on our provider's assessment of the scan. We will be in touch to make sure you have no questions and to schedule the recommended 3 month follow up scan.  Lung RADS 4 B:  indicates findings that are concerning. We will be in touch with you to schedule additional diagnostic testing based on our provider's  assessment of the scan.   

## 2021-07-25 NOTE — Progress Notes (Signed)
Shared Decision Making Visit Lung Cancer Screening Program [phone])   Eligibility: Age 75 y.o. Pack Years Smoking History Calculation 50 (# packs/per year x # years smoked) Recent History of coughing up blood  no Unexplained weight loss? no ( >Than 15 pounds within the last 6 months ) Prior History Lung / other cancer no (Diagnosis within the last 5 years already requiring surveillance chest CT Scans). Smoking Status Current Smoker Former Smokers: Years since quit: < 1 year  Quit Date: NA  Visit Components: Discussion included one or more decision making aids. yes Discussion included risk/benefits of screening. yes Discussion included potential follow up diagnostic testing for abnormal scans. yes Discussion included meaning and risk of over diagnosis. yes Discussion included meaning and risk of False Positives. yes Discussion included meaning of total radiation exposure. yes  Counseling Included: Importance of adherence to annual lung cancer LDCT screening. yes Impact of comorbidities on ability to participate in the program. yes Ability and willingness to under diagnostic treatment. yes  Smoking Cessation Counseling: Current Smokers:  Discussed importance of smoking cessation. yes Information about tobacco cessation classes and interventions provided to patient. yes Patient provided with "ticket" for LDCT Scan. No, it was a virtual visit Symptomatic Patient. no  Counseling(Intermediate counseling: > three minutes) [id number] Diagnosis Code: Tobacco Use Z72.0 Asymptomatic Patient yes  Counseling (Intermediate counseling: > three minutes counseling) [id number] Former Smokers:  Discussed the importance of maintaining cigarette abstinence. yes Diagnosis Code: Personal History of Nicotine Dependence. [id number] Information about tobacco cessation classes and interventions provided to patient. Yes Patient provided with "ticket" for LDCT Scan. yes Written Order for Lung Cancer Screening with  LDCT placed in Epic. Yes (CT Chest Lung Cancer Screening Low Dose W/O CM) [id number] Z12.2-Screening of respiratory organs Z87.891-Personal history of nicotine dependence  I have spent 25 minutes of face to face time with Mr Distefano discussing the risks and benefits of lung cancer screening. We viewed a power point together that explained in detail the above noted topics. We paused at intervals to allow for questions to be asked and answered to ensure understanding.We discussed that the single most powerful action that he can take to decrease his risk of developing lung cancer is to quit smoking. We discussed whether or not he is ready to commit to setting a quit date. We discussed options for tools to aid in quitting smoking including nicotine replacement therapy, non-nicotine medications, support groups, Quit Smart classes, and behavior modification. We discussed that often times setting smaller, more achievable goals, such as eliminating 1 cigarette a day for a week and then 2 cigarettes a day for a week can be helpful in slowly decreasing the number of cigarettes smoked. This allows for a sense of accomplishment as well as providing a clinical benefit. I gave him the " Be Stronger Than Your Excuses" card with contact information for community resources, classes, free nicotine replacement therapy, and access to mobile apps, text messaging, and on-line smoking cessation help. I have also given him my card and contact information in the event he needs to contact me. We discussed the time and location of the scan, and that either Doroteo Glassman RN or I will call with the results within 24-48 hours of receiving them. I have offered him  a copy of the power point we viewed  as a resource in the event they need reinforcement of the concepts we discussed today in the office. The patient verbalized understanding of all of  the above and had no  further questions upon leaving the office. They have my contact information  in the event they have any further questions.  I spent 3-5 minutes counseling on smoking cessation and the health risks of continued tobacco abuse.  I explained to the patient that there has been a high incidence of coronary artery disease noted on these exams. I explained that this is a non-gated exam therefore degree or severity cannot be determined. This patient is on statin therapy. I have asked the patient to follow-up with their PCP regarding any incidental finding of coronary artery disease and management with diet or medication as their PCP  feels is clinically indicated. The patient verbalized understanding of the above and had no further questions upon completion of the visit.      Martyn Ehrich, NP

## 2021-08-10 NOTE — Progress Notes (Signed)
Please call patient and let them  know their  low dose Ct was read as a Lung RADS 2: nodules that are benign in appearance and behavior with a very low likelihood of becoming a clinically active cancer due to size or lack of growth. Recommendation per radiology is for a repeat LDCT in 12 months. .Please let them  know we will order and schedule their  annual screening scan for 07/2022. Please let them  know there was notation of CAD on their  scan.  Please remind the patient  that this is a non-gated exam therefore degree or severity of disease  cannot be determined. Please have them  follow up with their PCP regarding potential risk factor modification, dietary therapy or pharmacologic therapy if clinically indicated. Pt.  is  currently on statin therapy. Please place order for annual  screening scan for  07/2022 and fax results to PCP. Thanks so much.  + 2 vessel CAD, on a statin , No cards notes There was also notation of Hepatic steatosis. This  is a term that describes the build up of fat in the liver. It is normal to have small amounts of fat in your liver, but when the proportion of liver cells that contain fat exceeds more than 5% it is indicative of early stage fatty liver.Treatment often involves reducing risk factors through a diet and exercise plan. It is generally a benign condition, but in a small percentage of patients it does require follow up. Please have the patient follow up with PCP regarding potential risk factor modification, dietary therapy or pharmacologic therapy if clinically indicated. Thanks so much

## 2021-08-11 ENCOUNTER — Encounter: Payer: Self-pay | Admitting: *Deleted

## 2021-08-11 DIAGNOSIS — Z87891 Personal history of nicotine dependence: Secondary | ICD-10-CM

## 2021-08-11 DIAGNOSIS — F17210 Nicotine dependence, cigarettes, uncomplicated: Secondary | ICD-10-CM

## 2022-07-25 ENCOUNTER — Ambulatory Visit
Admission: RE | Admit: 2022-07-25 | Discharge: 2022-07-25 | Disposition: A | Discharge status: Home / Self Care | Payer: Medicare Other | Source: Ambulatory Visit | Attending: Acute Care | Admitting: Acute Care

## 2022-07-25 DIAGNOSIS — F17210 Nicotine dependence, cigarettes, uncomplicated: Secondary | ICD-10-CM | POA: Diagnosis present

## 2022-07-25 DIAGNOSIS — Z87891 Personal history of nicotine dependence: Secondary | ICD-10-CM | POA: Insufficient documentation

## 2022-07-27 ENCOUNTER — Other Ambulatory Visit: Payer: Self-pay

## 2022-07-27 DIAGNOSIS — Z87891 Personal history of nicotine dependence: Secondary | ICD-10-CM

## 2022-07-27 DIAGNOSIS — Z122 Encounter for screening for malignant neoplasm of respiratory organs: Secondary | ICD-10-CM

## 2022-07-27 DIAGNOSIS — F17210 Nicotine dependence, cigarettes, uncomplicated: Secondary | ICD-10-CM

## 2022-08-17 ENCOUNTER — Encounter: Payer: Self-pay | Admitting: Gastroenterology

## 2022-08-20 ENCOUNTER — Encounter
Admission: RE | Disposition: A | Discharge status: Home / Self Care | Payer: Self-pay | Source: Home / Self Care | Attending: Gastroenterology

## 2022-08-20 ENCOUNTER — Ambulatory Visit
Admission: RE | Admit: 2022-08-20 | Discharge: 2022-08-20 | Disposition: A | Discharge status: Home / Self Care | Payer: Medicare Other | Attending: Gastroenterology | Admitting: Gastroenterology

## 2022-08-20 ENCOUNTER — Ambulatory Visit: Payer: Medicare Other | Admitting: Anesthesiology

## 2022-08-20 ENCOUNTER — Encounter: Payer: Self-pay | Admitting: Gastroenterology

## 2022-08-20 DIAGNOSIS — Z6831 Body mass index (BMI) 31.0-31.9, adult: Secondary | ICD-10-CM | POA: Diagnosis not present

## 2022-08-20 DIAGNOSIS — F17200 Nicotine dependence, unspecified, uncomplicated: Secondary | ICD-10-CM | POA: Insufficient documentation

## 2022-08-20 DIAGNOSIS — K317 Polyp of stomach and duodenum: Secondary | ICD-10-CM | POA: Insufficient documentation

## 2022-08-20 DIAGNOSIS — D123 Benign neoplasm of transverse colon: Secondary | ICD-10-CM | POA: Insufficient documentation

## 2022-08-20 DIAGNOSIS — R0602 Shortness of breath: Secondary | ICD-10-CM | POA: Diagnosis not present

## 2022-08-20 DIAGNOSIS — K2980 Duodenitis without bleeding: Secondary | ICD-10-CM | POA: Diagnosis not present

## 2022-08-20 DIAGNOSIS — Z7901 Long term (current) use of anticoagulants: Secondary | ICD-10-CM | POA: Diagnosis not present

## 2022-08-20 DIAGNOSIS — I10 Essential (primary) hypertension: Secondary | ICD-10-CM | POA: Insufficient documentation

## 2022-08-20 DIAGNOSIS — K5730 Diverticulosis of large intestine without perforation or abscess without bleeding: Secondary | ICD-10-CM | POA: Diagnosis not present

## 2022-08-20 DIAGNOSIS — R195 Other fecal abnormalities: Secondary | ICD-10-CM | POA: Insufficient documentation

## 2022-08-20 DIAGNOSIS — K31A12 Gastric intestinal metaplasia without dysplasia, involving the body (corpus): Secondary | ICD-10-CM | POA: Insufficient documentation

## 2022-08-20 DIAGNOSIS — Z86718 Personal history of other venous thrombosis and embolism: Secondary | ICD-10-CM | POA: Diagnosis not present

## 2022-08-20 DIAGNOSIS — D124 Benign neoplasm of descending colon: Secondary | ICD-10-CM | POA: Diagnosis not present

## 2022-08-20 DIAGNOSIS — K3189 Other diseases of stomach and duodenum: Secondary | ICD-10-CM | POA: Insufficient documentation

## 2022-08-20 DIAGNOSIS — Z1211 Encounter for screening for malignant neoplasm of colon: Secondary | ICD-10-CM | POA: Insufficient documentation

## 2022-08-20 DIAGNOSIS — E669 Obesity, unspecified: Secondary | ICD-10-CM | POA: Insufficient documentation

## 2022-08-20 DIAGNOSIS — K640 First degree hemorrhoids: Secondary | ICD-10-CM | POA: Diagnosis not present

## 2022-08-20 DIAGNOSIS — K2950 Unspecified chronic gastritis without bleeding: Secondary | ICD-10-CM | POA: Insufficient documentation

## 2022-08-20 DIAGNOSIS — R634 Abnormal weight loss: Secondary | ICD-10-CM | POA: Insufficient documentation

## 2022-08-20 DIAGNOSIS — D128 Benign neoplasm of rectum: Secondary | ICD-10-CM | POA: Diagnosis not present

## 2022-08-20 HISTORY — PX: COLONOSCOPY WITH PROPOFOL: SHX5780

## 2022-08-20 HISTORY — PX: ESOPHAGOGASTRODUODENOSCOPY: SHX5428

## 2022-08-20 LAB — GLUCOSE, CAPILLARY: Glucose-Capillary: 129 mg/dL — ABNORMAL HIGH (ref 70–99)

## 2022-08-20 SURGERY — COLONOSCOPY WITH PROPOFOL
Anesthesia: General

## 2022-08-20 MED ORDER — VASOPRESSIN 20 UNIT/ML IV SOLN
INTRAVENOUS | Status: DC | PRN
  Administered 2022-08-20 (×2): 1 [IU] via INTRAVENOUS

## 2022-08-20 MED ORDER — PROPOFOL 10 MG/ML IV BOLUS
INTRAVENOUS | Status: DC | PRN
  Administered 2022-08-20 (×2): 20 mg via INTRAVENOUS
  Administered 2022-08-20: 50 mg via INTRAVENOUS

## 2022-08-20 MED ORDER — LIDOCAINE HCL (CARDIAC) PF 100 MG/5ML IV SOSY
PREFILLED_SYRINGE | INTRAVENOUS | Status: DC | PRN
  Administered 2022-08-20: 100 mg via INTRAVENOUS

## 2022-08-20 MED ORDER — PROPOFOL 500 MG/50ML IV EMUL
INTRAVENOUS | Status: DC | PRN
  Administered 2022-08-20: 145 ug/kg/min via INTRAVENOUS

## 2022-08-20 MED ORDER — GLYCOPYRROLATE 0.2 MG/ML IJ SOLN
INTRAMUSCULAR | Status: DC | PRN
  Administered 2022-08-20: .2 mg via INTRAVENOUS

## 2022-08-20 MED ORDER — METHYLENE BLUE 1 % INJ SOLN
INTRAVENOUS | Status: DC | PRN
  Administered 2022-08-20: 7 mL via SUBMUCOSAL

## 2022-08-20 MED ORDER — SODIUM CHLORIDE 0.9 % IV SOLN
INTRAVENOUS | Status: DC
Start: 2022-08-20 — End: 2022-08-20

## 2022-08-20 MED ORDER — PHENYLEPHRINE 80 MCG/ML (10ML) SYRINGE FOR IV PUSH (FOR BLOOD PRESSURE SUPPORT)
PREFILLED_SYRINGE | INTRAVENOUS | Status: DC | PRN
  Administered 2022-08-20 (×3): 160 ug via INTRAVENOUS

## 2022-08-20 MED ORDER — SPOT INK MARKER SYRINGE KIT
PACK | SUBMUCOSAL | Status: DC | PRN
  Administered 2022-08-20: 4 mL via SUBMUCOSAL

## 2022-08-20 NOTE — Interval H&P Note (Signed)
History and Physical Interval Note: Preprocedure H&P from 08/20/22  was reviewed and there was no interval change after seeing and examining the patient.  Written consent was obtained from the patient after discussion of risks, benefits, and alternatives. Patient has consented to proceed with Esophagogastroduodenoscopy and Colonoscopy with possible intervention   08/20/2022 11:15 AM  Derrick Morales  has presented today for surgery, with the diagnosis of R19.5  - Positive colorectal cancer screening using Cologuard test Z86.010 - Hx of adenomatous colonic polyps R63.4 - Unintentional weight loss.  The various methods of treatment have been discussed with the patient and family. After consideration of risks, benefits and other options for treatment, the patient has consented to  Procedure(s): COLONOSCOPY WITH PROPOFOL (N/A) ESOPHAGOGASTRODUODENOSCOPY (EGD) (N/A) as a surgical intervention.  The patient's history has been reviewed, patient examined, no change in status, stable for surgery.  I have reviewed the patient's chart and labs.  Questions were answered to the patient's satisfaction.     Annamaria Helling

## 2022-08-20 NOTE — Anesthesia Preprocedure Evaluation (Addendum)
Anesthesia Evaluation  Patient identified by MRN, date of birth, ID band Patient awake    Reviewed: Allergy & Precautions, NPO status , Patient's Chart, lab work & pertinent test results  History of Anesthesia Complications Negative for: history of anesthetic complications  Airway Mallampati: II  TM Distance: >3 FB Neck ROM: full    Dental no notable dental hx.  Poor dentition:   Pulmonary shortness of breath and with exertion, sleep apnea (no cpap) , Current Smoker and Patient abstained from smoking.,    Pulmonary exam normal        Cardiovascular hypertension, + DOE and + DVT (legs: 2007)  Normal cardiovascular exam     Neuro/Psych Foot neuropathy negative psych ROS   GI/Hepatic negative GI ROS, Neg liver ROS,   Endo/Other  diabetes, Well Controlled, Type 2  Renal/GU negative Renal ROS  negative genitourinary   Musculoskeletal  (+) Arthritis , gout   Abdominal (+) + obese,   Peds  Hematology negative hematology ROS (+)   Anesthesia Other Findings Covid: NEG;  Last xarelto: 6/16  Reproductive/Obstetrics                            Anesthesia Physical  Anesthesia Plan  ASA: III  Anesthesia Plan: General   Post-op Pain Management: Minimal or no pain anticipated   Induction: Intravenous  PONV Risk Score and Plan: Propofol infusion and TIVA  Airway Management Planned: Natural Airway  Additional Equipment:   Intra-op Plan:   Post-operative Plan:   Informed Consent: I have reviewed the patients History and Physical, chart, labs and discussed the procedure including the risks, benefits and alternatives for the proposed anesthesia with the patient or authorized representative who has indicated his/her understanding and acceptance.       Plan Discussed with: CRNA  Anesthesia Plan Comments:        Anesthesia Quick Evaluation

## 2022-08-20 NOTE — H&P (Signed)
Pre-Procedure H&P   Patient ID: Derrick Morales is a 76 y.o. male.  Gastroenterology Provider: Annamaria Helling, DO  Referring Provider: Octavia Bruckner, PA PCP: Tracie Harrier, MD  Date: 08/20/2022  HPI Derrick Morales is a 76 y.o. male who presents today for Esophagogastroduodenoscopy and Colonoscopy for  Abnormal weight loss, positive Cologuard.  Patient last underwent colonoscopy in 2011 with diverticulosis and 2 tubular adenomatous polyps.  He denies any family history of colon cancer or colon polyps.  He was Cologuard positive on June 13 of this year.  He does have a history of DVT which she is on Xarelto for.  His last dose was on Friday morning and has otherwise been held.  He is a pack-a-day smoker.  He has noted 10 to 15 pound weight loss in the last 15 months.  Here ports this is unintentional.  He denies any melena or hematochezia.  Occasionally has variable loose stools.  His last hemoglobin is 13.4 MCV 94 platelets 265,000.  Very rare GERD that he uses occasional Tums for no dysphagia or odynophagia.  No other acute gi complaints.   Past Medical History:  Diagnosis Date   Arthritis    neck   Diabetes mellitus without complication ([place])    type 2   DVT (deep venous thrombosis) ([facility])    h/o in each leg   Dyspnea    1 flight stairs   Gout    Hypertension    Lower extremity edema    Neuropathy of foot    bilateral   Sleep apnea    cannot tolerate CPAP    Past Surgical History:  Procedure Laterality Date   ANKLE FRACTURE SURGERY  2005   CATARACT EXTRACTION W/PHACO Right 05/27/2019   Procedure: CATARACT EXTRACTION PHACO AND INTRAOCULAR LENS PLACEMENT ([place])  RIGHT DIABETIC;  Surgeon: Leandrew Koyanagi, MD;  Location: [place];  Service: Ophthalmology;  Laterality: Right;  diabetic - oral meds   Testicular Torsion Repair Left 1963   TRANSURETHRAL RESECTION OF BLADDER TUMOR WITH MITOMYCIN-C N/A 12/22/2018   Procedure: TRANSURETHRAL  RESECTION OF BLADDER TUMOR WITH Gemcitabine;  Surgeon: Hollice Espy, MD;  Location: [facility];  Service: Urology;  Laterality: N/A;    Family History No h/o GI disease or malignancy  Review of Systems  Constitutional:  Positive for unexpected weight change. Negative for activity change, appetite change, chills, diaphoresis, fatigue and fever.  HENT:  Negative for trouble swallowing and voice change.   Respiratory:  Negative for shortness of breath and wheezing.   Cardiovascular:  Negative for chest pain, palpitations and leg swelling.  Gastrointestinal:  Positive for constipation and diarrhea. Negative for abdominal distention, abdominal pain, anal bleeding, blood in stool, nausea and vomiting.  Musculoskeletal:  Negative for arthralgias and myalgias.  Skin:  Negative for color change and pallor.  Neurological:  Negative for dizziness, syncope and weakness.  Psychiatric/Behavioral:  Negative for confusion. The patient is not nervous/anxious.   All other systems reviewed and are negative.    Medications No current facility-administered medications on file prior to encounter.   Current Outpatient Medications on File Prior to Encounter  Medication Sig Dispense Refill   furosemide (LASIX) 20 MG tablet Take 20 mg by mouth daily.      lisinopril (PRINIVIL,ZESTRIL) 20 MG tablet Take 20 mg by mouth daily.      allopurinol (ZYLOPRIM) 100 MG tablet Take 100 mg by mouth daily.      b complex vitamins tablet Take 1 tablet by mouth  daily.     CANNABIDIOL PO Take 1 capsule by mouth daily.     HYDROcodone-acetaminophen (NORCO/VICODIN) 5-325 MG tablet Take 1 tablet by mouth every 6 (six) hours as needed (pain.). 4 tablet 0   lovastatin (MEVACOR) 20 MG tablet Take 20 mg by mouth daily.      metFORMIN (GLUCOPHAGE) 500 MG tablet Take 500 mg by mouth daily.      XARELTO 20 MG TABS tablet Take 20 mg by mouth daily.       Pertinent medications related to GI and procedure were reviewed by me with the  patient prior to the procedure   Current Facility-Administered Medications:    0.9 %  sodium chloride infusion, , Intravenous, Continuous, Oluwasemilore, Bahl, DO, Last Rate: 20 mL/hr at 08/20/22 1018, New Bag at 08/20/22 1018  sodium chloride 20 mL/hr at 08/20/22 1018       No Known Allergies Allergies were reviewed by me prior to the procedure  Objective   Body mass index is 31.63 kg/m. Vitals:   08/20/22 0956  BP: (!) 146/74  Pulse: 68  Resp: 18  Temp: (!) 96.2 F (35.7 C)  TempSrc: Temporal  SpO2: 100%  Weight: 94.3 kg  Height: '5\' 8"'$  (1.727 m)     Physical Exam Vitals and nursing note reviewed.  Constitutional:      General: He is not in acute distress.    Appearance: Normal appearance. He is obese. He is not ill-appearing, toxic-appearing or diaphoretic.  HENT:     Head: Normocephalic and atraumatic.     Nose: Nose normal.     Mouth/Throat:     Mouth: Mucous membranes are moist.     Pharynx: Oropharynx is clear.  Eyes:     General: No scleral icterus.    Extraocular Movements: Extraocular movements intact.  Cardiovascular:     Rate and Rhythm: Normal rate and regular rhythm.     Heart sounds: Normal heart sounds. No murmur heard.    No friction rub. No gallop.  Pulmonary:     Effort: Pulmonary effort is normal. No respiratory distress.     Breath sounds: Normal breath sounds. No wheezing, rhonchi or rales.  Abdominal:     General: Bowel sounds are normal. There is no distension.     Palpations: Abdomen is soft.     Tenderness: There is no abdominal tenderness. There is no guarding or rebound.  Musculoskeletal:     Cervical back: Neck supple.     Right lower leg: No edema.     Left lower leg: No edema.  Skin:    General: Skin is warm and dry.     Coloration: Skin is not jaundiced or pale.  Neurological:     General: No focal deficit present.     Mental Status: He is alert and oriented to person, place, and time. Mental status is at baseline.   Psychiatric:        Mood and Affect: Mood normal.        Behavior: Behavior normal.        Thought Content: Thought content normal.        Judgment: Judgment normal.      Assessment:  Derrick Morales is a 76 y.o. male  who presents today for Esophagogastroduodenoscopy and Colonoscopy for Abnormal weight loss, positive Cologuard.  Plan:  Esophagogastroduodenoscopy and Colonoscopy with possible intervention today  Esophagogastroduodenoscopy and Colonoscopy with possible biopsy, control of bleeding, polypectomy, and interventions as necessary has been discussed with  the patient/patient representative. Informed consent was obtained from the patient/patient representative after explaining the indication, nature, and risks of the procedure including but not limited to death, bleeding, perforation, missed neoplasm/lesions, cardiorespiratory compromise, and reaction to medications. Opportunity for questions was given and appropriate answers were provided. Patient/patient representative has verbalized understanding is amenable to undergoing the procedure.   Annamaria Helling, DO  [facility]  Portions of the record may have been created with voice recognition software. Occasional wrong-word or 'sound-a-like' substitutions may have occurred due to the inherent limitations of voice recognition software.  Read the chart carefully and recognize, using context, where substitutions may have occurred.

## 2022-08-20 NOTE — Op Note (Signed)
[facility] Gastroenterology Patient Name: Derrick Morales Procedure Date: 08/20/2022 11:10 AM MRN: [id number] Account #: [id number] Date of Birth: 09/03/1946 Admit Type: Outpatient Age: 76 Room: [facility] ENDO ROOM 2 Gender: Male Note Status: Finalized Instrument Name: Upper Endoscope [id number] Procedure:             Upper GI endoscopy Indications:           Weight loss Providers:             Annamaria Helling DO, DO Medicines:             Monitored Anesthesia Care Complications:         No immediate complications. Estimated blood loss:                         Minimal. Procedure:             Pre-Anesthesia Assessment:                        - Prior to the procedure, a History and Physical was                         performed, and patient medications and allergies were                         reviewed. The patient is competent. The risks and                         benefits of the procedure and the sedation options and                         risks were discussed with the patient. All questions                         were answered and informed consent was obtained.                         Patient identification and proposed procedure were                         verified by the physician, the nurse, the anesthetist                         and the technician in the endoscopy suite. Mental                         Status Examination: alert and oriented. Airway                         Examination: normal oropharyngeal airway and neck                         mobility. Respiratory Examination: clear to                         auscultation. CV Examination: RRR, no murmurs, no S3                         or S4. Prophylactic Antibiotics: The patient does not  require prophylactic antibiotics. Prior                         Anticoagulants: The patient has taken Xarelto                         (rivaroxaban), last dose was 3 days prior to                          procedure. ASA Grade Assessment: III - A patient with                         severe systemic disease. After reviewing the risks and                         benefits, the patient was deemed in satisfactory                         condition to undergo the procedure. The anesthesia                         plan was to use monitored anesthesia care (MAC).                         Immediately prior to administration of medications,                         the patient was re-assessed for adequacy to receive                         sedatives. The heart rate, respiratory rate, oxygen                         saturations, blood pressure, adequacy of pulmonary                         ventilation, and response to care were monitored                         throughout the procedure. The physical status of the                         patient was re-assessed after the procedure.                        After obtaining informed consent, the endoscope was                         passed under direct vision. Throughout the procedure,                         the patient's blood pressure, pulse, and oxygen                         saturations were monitored continuously. The Endoscope                         was introduced through the mouth, and advanced to the  second part of duodenum. The upper GI endoscopy was                         accomplished without difficulty. The patient tolerated                         the procedure well. Findings:      Localized mild inflammation characterized by erythema was found in the       duodenal bulb. Biopsies were taken with a cold forceps for histology.       This was noted on the posterior wall of the bulb. Full visualization       could not be completed with gastroscope. Estimated blood loss was       minimal.      Localized moderate mucosal changes characterized by granularity,       suspected inflammation vs prominent brunner glands were  found in the       first portion of the duodenum. Biopsies were taken with a cold forceps       for histology. Estimated blood loss was minimal.      Localized mild inflammation characterized by adherent blood, erosions       and erythema was found in the gastric antrum. Biopsies were taken with a       cold forceps for Helicobacter pylori testing. Estimated blood loss was       minimal.      A single 1 to 2 mm sessile polyp with no bleeding and no stigmata of       recent bleeding was found in the cardia. The polyp was removed with a       cold snare. Polyp resection was incomplete. The resected tissue was       retrieved. Attempted to remove rest of polyp with forceps. Difficult       location due to need for retroflexion to visualize. Appeared all of       polyp was removed. Estimated blood loss was minimal.      The exam of the stomach was otherwise normal.      Esophagogastric landmarks were identified: the gastroesophageal junction       was found at 42 cm from the incisors.      The Z-line was regular. Estimated blood loss: none.      The exam of the esophagus was otherwise normal. Impression:            - Duodenitis. Biopsied.                        - Mucosal changes in the duodenum. Biopsied.                        - Gastritis. Biopsied.                        - A single gastric polyp. Incomplete resection.                         Resected tissue retrieved.                        - Esophagogastric landmarks identified.                        -  Z-line regular. Recommendation:        - Patient has a contact number available for                         emergencies. The signs and symptoms of potential                         delayed complications were discussed with the patient.                         Return to normal activities tomorrow. Written                         discharge instructions were provided to the patient.                        - Discharge patient to home.                         - Resume previous diet.                        - Continue present medications.                        - No aspirin, ibuprofen, naproxen, or other                         non-steroidal anti-inflammatory drugs for 5 days after                         polyp removal.                        - See colonoscopy report for xarelto recommendations.                        - Await pathology results.                        - Return to GI clinic as previously scheduled.                        - The findings and recommendations were discussed with                         the patient. Procedure Code(s):     --- Professional ---                        [phone], Esophagogastroduodenoscopy, flexible,                         transoral; with removal of tumor(s), polyp(s), or                         other lesion(s) by snare technique                        [id number], 59,51, Esophagogastroduodenoscopy, flexible,  transoral; with biopsy, single or multiple Diagnosis Code(s):     --- Professional ---                        K29.80, Duodenitis without bleeding                        K31.89, Other diseases of stomach and duodenum                        K29.70, Gastritis, unspecified, without bleeding                        K31.7, Polyp of stomach and duodenum                        R63.4, Abnormal weight loss CPT copyright 2019 [employer]. All rights reserved. The codes documented in this report are preliminary and upon coder review may  be revised to meet current compliance requirements. Attending Participation:      I personally performed the entire procedure. Volney American, DO Annamaria Helling DO, DO 08/20/2022 11:49:47 AM This report has been signed electronically. Number of Addenda: 0 Note Initiated On: 08/20/2022 11:10 AM Estimated Blood Loss:  Estimated blood loss was minimal.      [facility]

## 2022-08-20 NOTE — Op Note (Addendum)
[facility] Gastroenterology Patient Name: Derrick Morales Procedure Date: 08/20/2022 11:09 AM MRN: [id number] Account #: [id number] Date of Birth: February 13, 1946 Admit Type: Outpatient Age: 76 Room: [facility] ENDO ROOM 2 Gender: Male Note Status: Supervisor Override Instrument Name: Jasper Riling [id number] Procedure:             Colonoscopy Indications:           Positive Cologuard test, Personal history of colonic                         polyps Providers:             Annamaria Helling DO, DO Medicines:             Monitored Anesthesia Care Complications:         No immediate complications. Estimated blood loss:                         Minimal. Procedure:             Pre-Anesthesia Assessment:                        - Prior to the procedure, a History and Physical was                         performed, and patient medications and allergies were                         reviewed. The patient is competent. The risks and                         benefits of the procedure and the sedation options and                         risks were discussed with the patient. All questions                         were answered and informed consent was obtained.                         Patient identification and proposed procedure were                         verified by the physician, the nurse, the anesthetist                         and the technician in the endoscopy suite. Mental                         Status Examination: alert and oriented. Airway                         Examination: normal oropharyngeal airway and neck                         mobility. Respiratory Examination: clear to                         auscultation. CV Examination: RRR, no murmurs, no S3  or S4. Prophylactic Antibiotics: The patient does not                         require prophylactic antibiotics. Prior                         Anticoagulants: The patient has taken Xarelto                          (rivaroxaban), last dose was 3 days prior to                         procedure. ASA Grade Assessment: III - A patient with                         severe systemic disease. After reviewing the risks and                         benefits, the patient was deemed in satisfactory                         condition to undergo the procedure. The anesthesia                         plan was to use monitored anesthesia care (MAC).                         Immediately prior to administration of medications,                         the patient was re-assessed for adequacy to receive                         sedatives. The heart rate, respiratory rate, oxygen                         saturations, blood pressure, adequacy of pulmonary                         ventilation, and response to care were monitored                         throughout the procedure. The physical status of the                         patient was re-assessed after the procedure.                        After obtaining informed consent, the colonoscope was                         passed under direct vision. Throughout the procedure,                         the patient's blood pressure, pulse, and oxygen                         saturations were monitored continuously. The  Colonoscope was introduced through the anus and                         advanced to the the terminal ileum, with                         identification of the appendiceal orifice and IC                         valve. The colonoscopy was performed without                         difficulty. The patient tolerated the procedure well.                         The quality of the bowel preparation was evaluated                         using the BBPS [facility] Bowel Preparation Scale) with                         scores of: Right Colon = 2 (minor amount of residual                         staining, small fragments of stool and/or opaque                          liquid, but mucosa seen well), Transverse Colon = 3                         (entire mucosa seen well with no residual staining,                         small fragments of stool or opaque liquid) and Left                         Colon = 3 (entire mucosa seen well with no residual                         staining, small fragments of stool or opaque liquid).                         The total BBPS score equals 8. The quality of the                         bowel preparation was excellent. The terminal ileum,                         ileocecal valve, appendiceal orifice, and rectum were                         photographed. Findings:      The perianal and digital rectal examinations were normal. Pertinent       negatives include normal sphincter tone.      The terminal ileum appeared normal. Estimated blood loss: none.      Multiple small-mouthed diverticula were found in the left colon.  Estimated blood loss: none.      Non-bleeding internal hemorrhoids were found during retroflexion. The       hemorrhoids were Grade I (internal hemorrhoids that do not prolapse).       Estimated blood loss: none.      Three sessile polyps were found in the transverse colon. The polyps were       3 to 4 mm in size. These polyps were removed with a cold snare.       Resection and retrieval were complete. Estimated blood loss was minimal.      A 3 to 4 mm polyp was found in the descending colon. The polyp was       sessile. The polyp was removed with a cold snare. Resection and       retrieval were complete. Estimated blood loss was minimal.      A 7 to 9 mm polyp was found in the descending colon. The polyp was       sessile. The polyp was removed with a hot snare. Resection and retrieval       were complete. To prevent bleeding after the polypectomy, one hemostatic       clip was successfully placed (MR conditional). There was no bleeding at       the end of the procedure. Estimated blood loss was  minimal.      A 1 to 2 mm polyp was found in the rectum. The polyp was sessile. The       polyp was removed with a cold snare. Resection and retrieval were       complete. Estimated blood loss was minimal.      A 15 to 17 mm polyp was found in the hepatic flexure. The polyp was       sessile. Area was successfully injected with 3 mL Eleview for lesion       assessment, and this injection appeared to lift the lesion adequately.       The polyp was removed with a hot snare. Resection and retrieval were       complete. Taken in piecemeal. To prevent bleeding after the polypectomy,       two hemostatic clips were successfully placed (MR conditional). There       was no bleeding at the end of the procedure. Estimated blood loss was       minimal. Area was tattooed with an injection of 2 mL of [place] ink.       Estimated blood loss: none.      Two sessile polyps were found in the hepatic flexure. The polyps were       medium in size. Polypectomy was not attempted due to inadequate lift and       technical difficulty. One polyp noted in distal ascening/hepatic       flexure. This had inadequate lift upon injecting 3cc of eleview, so       polypectomy was not attempted. This was also appreciated in right colon       retroflexion. A tattoo was placed proximal to this as other tattoo was       placed distal to nearby piecemeal polypectomy. Tattoo bookending these       two polyps.      A third large polyp in "s" shape crossing folds was just distal to       piecemeal polypectomy tattoo in proximal transverse/hepatic flexure.      The exam was otherwise without  abnormality on direct and retroflexion       views. Impression:            - The examined portion of the ileum was normal.                        - Diverticulosis in the left colon.                        - Non-bleeding internal hemorrhoids.                        - Three 3 to 4 mm polyps in the transverse colon,                          removed with a cold snare. Resected and retrieved.                        - One 3 to 4 mm polyp in the descending colon, removed                         with a cold snare. Resected and retrieved.                        - One 7 to 9 mm polyp in the descending colon, removed                         with a hot snare. Resected and retrieved. Clip (MR                         conditional) was placed.                        - One 1 to 2 mm polyp in the rectum, removed with a                         cold snare. Resected and retrieved.                        - One 15 to 17 mm polyp at the hepatic flexure,                         removed with a hot snare. Resected and retrieved.                         Injected. Clips (MR conditional) were placed. Tattooed.                        - Two medium polyps at the hepatic flexure. Resection                         not attempted.                        - The examination was otherwise normal on direct and                         retroflexion views. Recommendation:        -  Patient has a contact number available for                         emergencies. The signs and symptoms of potential                         delayed complications were discussed with the patient.                         Return to normal activities tomorrow. Written                         discharge instructions were provided to the patient.                        - Discharge patient to home.                        - Soft diet today.                        - Continue present medications.                        - No aspirin, ibuprofen, naproxen, or other                         non-steroidal anti-inflammatory drugs for 5 days after                         polyp removal.                        - Resume Xarelto (rivaroxaban) at prior dose in 3                         days. Refer to managing physician for further                         adjustment of therapy.                        - Await  pathology results.                        - Refer to Advanced endoscopist. at appointment to be                         scheduled.                        - Return to GI clinic as previously scheduled.                        - The findings and recommendations were discussed with                         the patient. Procedure Code(s):     --- Professional ---                        [phone], Colonoscopy, flexible; with removal of  tumor(s), polyp(s), or other lesion(s) by snare                         technique                        [id number], Colonoscopy, flexible; with directed submucosal                         injection(s), any substance Diagnosis Code(s):     --- Professional ---                        K64.0, First degree hemorrhoids                        K63.5, Polyp of colon                        K62.1, Rectal polyp                        R19.5, Other fecal abnormalities                        K57.30, Diverticulosis of large intestine without                         perforation or abscess without bleeding CPT copyright 2019 [employer]. All rights reserved. The codes documented in this report are preliminary and upon coder review may  be revised to meet current compliance requirements. Attending Participation:      I personally performed the entire procedure. Volney American, DO Annamaria Helling DO, DO 08/20/2022 1:05:16 PM This report has been signed electronically. Number of Addenda: 0 Note Initiated On: 08/20/2022 11:09 AM Scope Withdrawal Time: 0 hours 24 minutes 37 seconds  Total Procedure Duration: 1 hour 3 minutes 27 seconds  Estimated Blood Loss:  Estimated blood loss was minimal.      [facility]

## 2022-08-20 NOTE — Transfer of Care (Signed)
Immediate Anesthesia Transfer of Care Note  Patient: Derrick Morales  Procedure(s) Performed: COLONOSCOPY WITH PROPOFOL ESOPHAGOGASTRODUODENOSCOPY (EGD)  Patient Location: Endoscopy Unit  Anesthesia Type:General  Level of Consciousness: drowsy and patient cooperative  Airway & Oxygen Therapy: Patient Spontanous Breathing and Patient connected to face mask oxygen  Post-op Assessment: Report given to RN and Post -op Vital signs reviewed and stable  Post vital signs: Reviewed and stable  Last Vitals:  Vitals Value Taken Time  BP 114/55 08/20/22 1254  Temp    Pulse 69 08/20/22 1254  Resp 12 08/20/22 1254  SpO2 99 % 08/20/22 1254    Last Pain:  Vitals:   08/20/22 1254  TempSrc:   PainSc: Asleep         Complications: No notable events documented.

## 2022-08-20 NOTE — Anesthesia Procedure Notes (Signed)
Procedure Name: General with mask airway Date/Time: 08/20/2022 11:38 AM  Performed by: Kelton Pillar, CRNAPre-anesthesia Checklist: Patient identified, Emergency Drugs available, Suction available and Patient being monitored Patient Re-evaluated:Patient Re-evaluated prior to induction Oxygen Delivery Method: Simple face mask Induction Type: IV induction Placement Confirmation: positive ETCO2, CO2 detector and breath sounds checked- equal and bilateral Dental Injury: Teeth and Oropharynx as per pre-operative assessment

## 2022-08-20 NOTE — Anesthesia Postprocedure Evaluation (Signed)
Anesthesia Post Note  Patient: Derrick Morales  Procedure(s) Performed: COLONOSCOPY WITH PROPOFOL ESOPHAGOGASTRODUODENOSCOPY (EGD)  Patient location during evaluation: PACU Anesthesia Type: General Level of consciousness: awake and alert Pain management: pain level controlled Vital Signs Assessment: post-procedure vital signs reviewed and stable Respiratory status: spontaneous breathing, nonlabored ventilation and respiratory function stable Cardiovascular status: blood pressure returned to baseline and stable Postop Assessment: no apparent nausea or vomiting Anesthetic complications: no   No notable events documented.   Last Vitals:  Vitals:   08/20/22 1314 08/20/22 1324  BP: (!) 119/51 116/78  Pulse: 71 75  Resp: 15 12  Temp:    SpO2: 100% 100%    Last Pain:  Vitals:   08/20/22 1324  TempSrc:   PainSc: 0-No pain                 [place] [facility]

## 2022-08-21 ENCOUNTER — Encounter: Payer: Self-pay | Admitting: Gastroenterology

## 2022-08-22 LAB — SURGICAL PATHOLOGY

## 2022-10-07 IMAGING — CT CT CHEST LUNG CANCER SCREENING LOW DOSE W/O CM
2 of 5 series · 15 of 40 positions shown, 18 images · non-contrast
Comparison: 07/16/2008 chest CT.

CLINICAL DATA: 74-year-old asymptomatic male current smoker with 50
pack-year smoking history.

EXAM:
CT CHEST WITHOUT CONTRAST LOW-DOSE FOR LUNG CANCER SCREENING
TECHNIQUE: Multidetector CT imaging of the chest was performed following the
standard protocol without IV contrast.

[Series 3: lung 1.00 · axial · 0.73mm/px · z∈[-1204,-900]mm · 12 of 337 slices shown, 15 images]
[im 17/337  mediastinal]
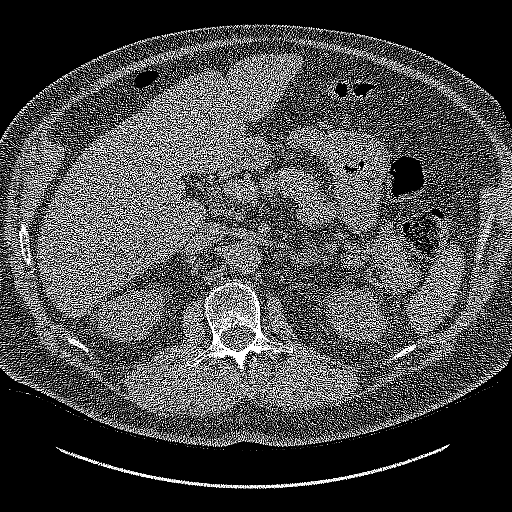
[im 17/337  lung]
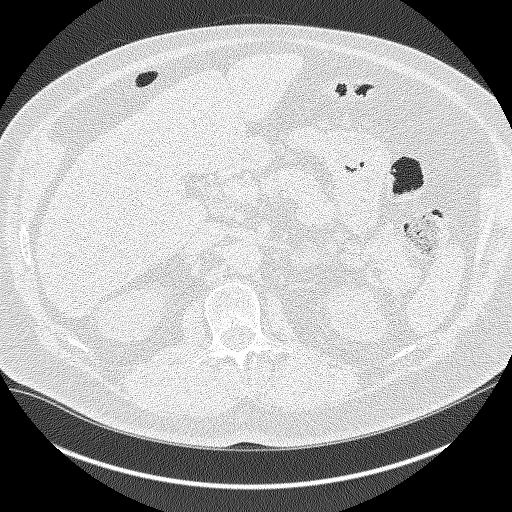
[im 49/337  lung]
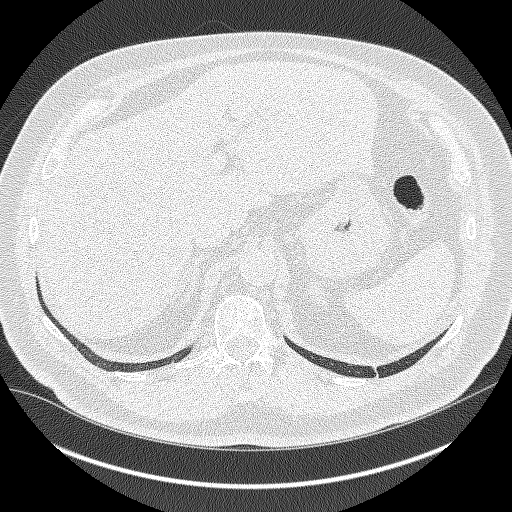
[im 81/337  lung]
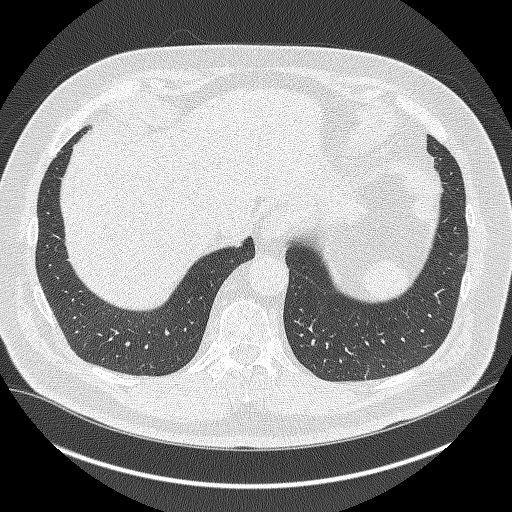
[im 97/337  lung]
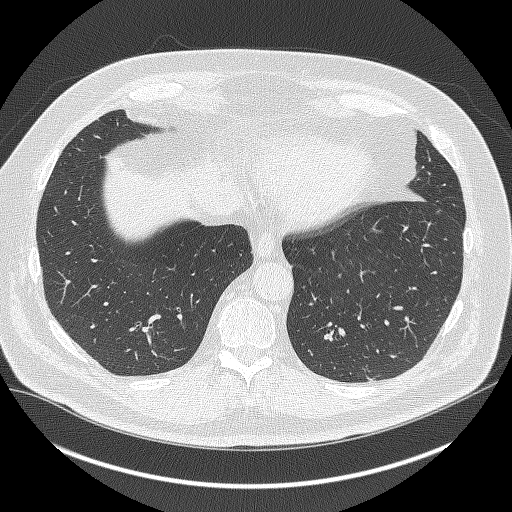
[im 129/337  mediastinal]
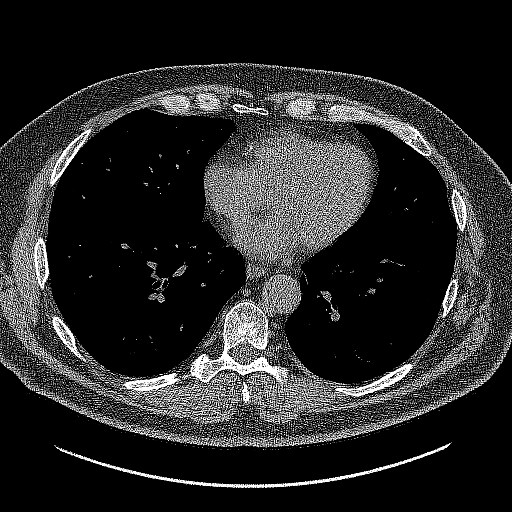
[im 129/337  lung]
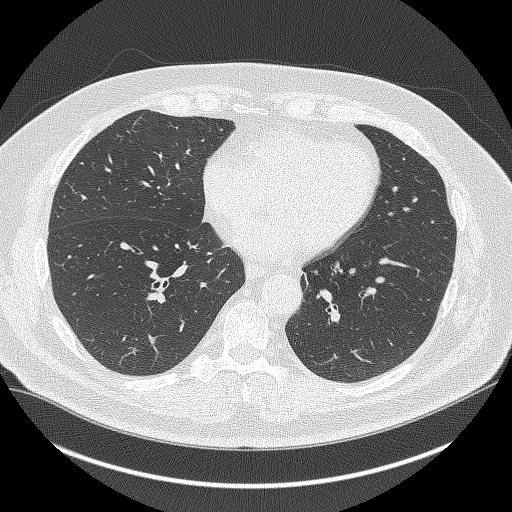
[im 161/337  lung]
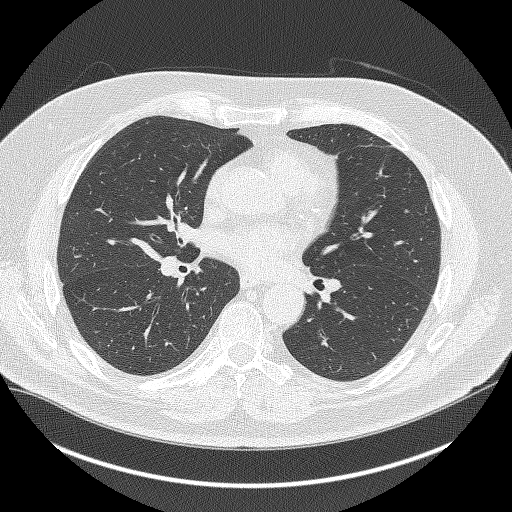
[im 177/337  lung]
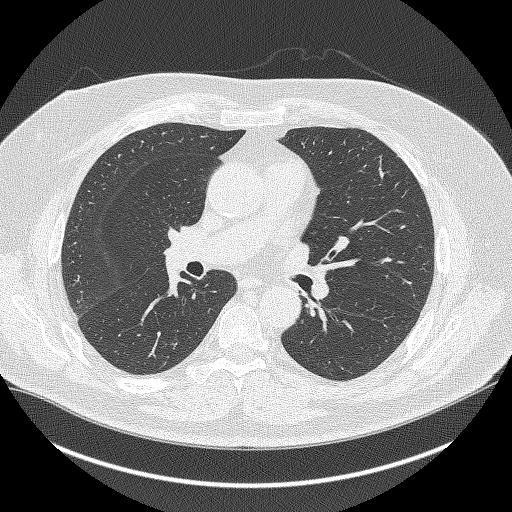
[im 209/337  lung]
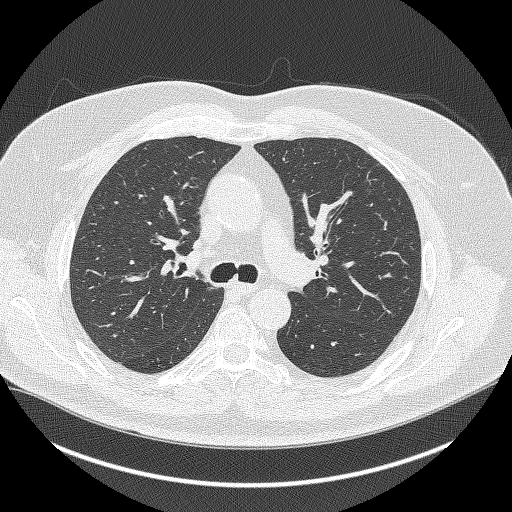
[im 241/337  mediastinal]
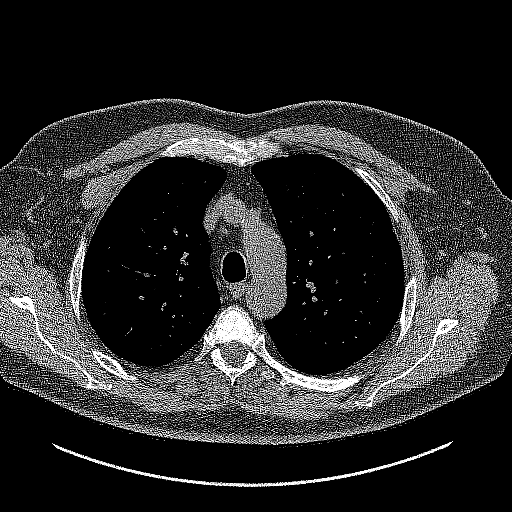
[im 241/337  lung]
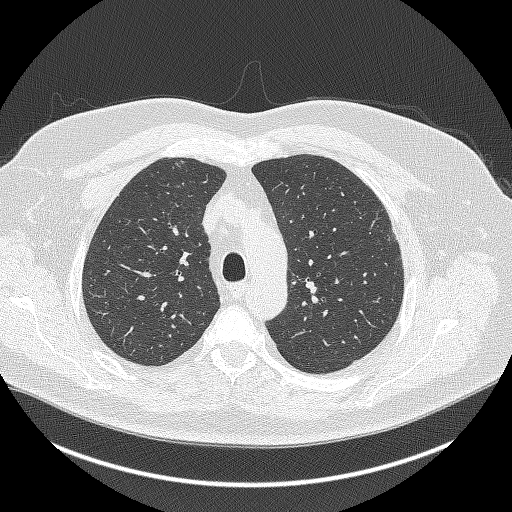
[im 257/337  lung]
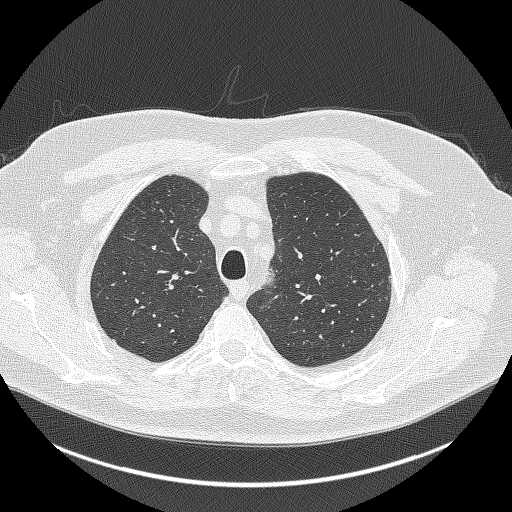
[im 289/337  lung]
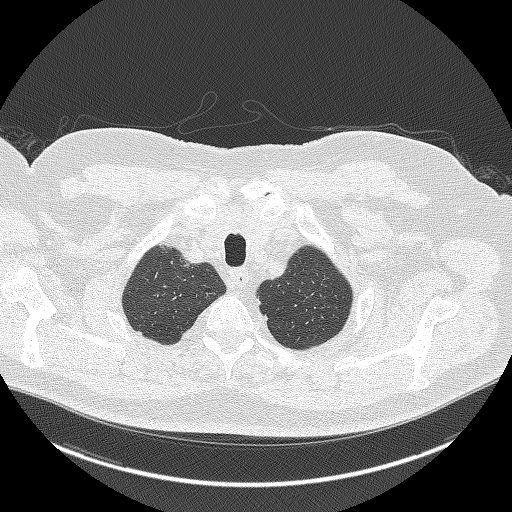
[im 321/337  lung]
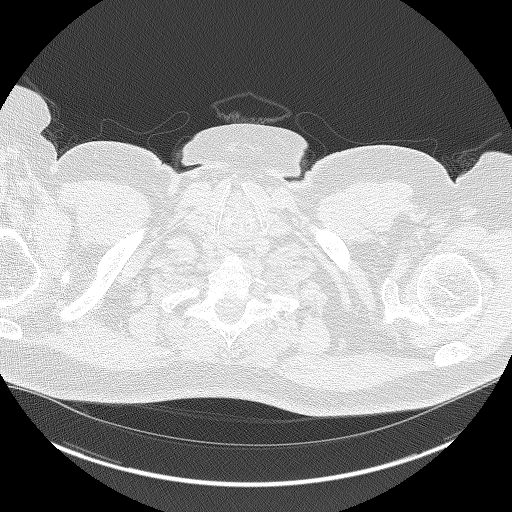

[Series 5: coronals lung 1.00 cor · coronal · 0.66mm/px · 3 of 305 slices shown]
[im 61/305  lung]
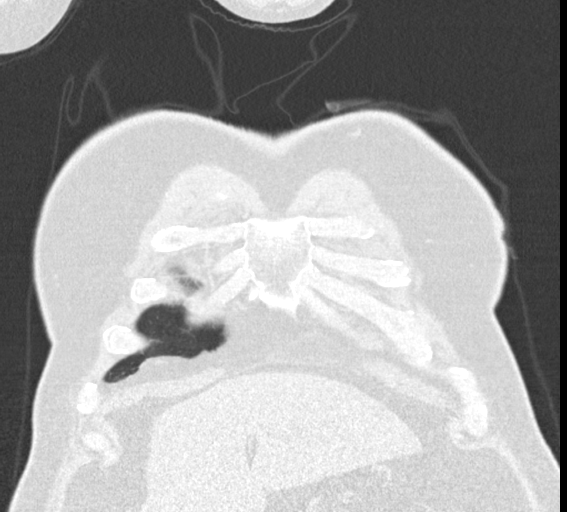
[im 122/305  lung]
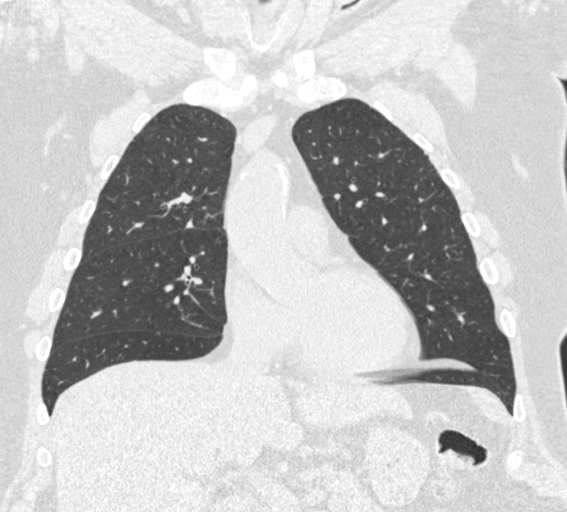
[im 183/305  lung]
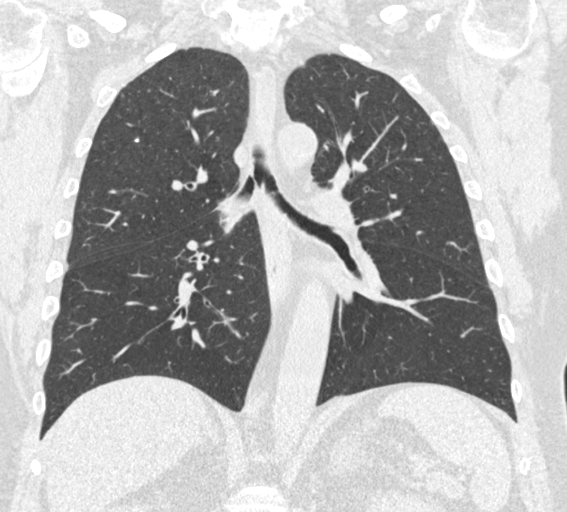

[15 of 40 positions shown; findings below may reference images not displayed]

FINDINGS: Cardiovascular: Normal heart size. No significant pericardial
effusion/thickening. Left anterior descending and right coronary
atherosclerosis. Atherosclerotic thoracic aorta with mildly dilated
4.0 cm ascending thoracic aorta. Normal caliber pulmonary arteries.

Mediastinum/Nodes: No discrete thyroid nodules. Unremarkable
esophagus. No pathologically enlarged axillary, mediastinal or hilar
lymph nodes, noting limited sensitivity for the detection of hilar
adenopathy on this noncontrast study.

Lungs/Pleura: No pneumothorax. No pleural effusion. Mild
centrilobular emphysema with diffuse bronchial wall thickening. No
acute consolidative airspace disease or lung masses. A few scattered
small solid pulmonary nodules in both lungs, largest 4.5 mm in
volume derived mean diameter in the dependent left lower lobe
(series 3/image 266).

Upper abdomen: Mild diffuse hepatic steatosis. Left adrenal 2.9 cm
nodule with density -5 HU, compatible with an adenoma, stable.
Simple 2.0 cm medial upper right renal cyst.

Musculoskeletal: No aggressive appearing focal osseous lesions. Mild
thoracic spondylosis.
IMPRESSION: 1. Lung-RADS 2, benign appearance or behavior. Continue annual
screening with low-dose chest CT without contrast in 12 months.
2. Two-vessel coronary atherosclerosis.
3. Stable left adrenal adenoma.
4. Mild diffuse hepatic steatosis.
5. Aortic Atherosclerosis ([id number].B) and Emphysema ([id number]).

## 2023-05-19 ENCOUNTER — Other Ambulatory Visit: Payer: Self-pay | Admitting: Acute Care

## 2023-05-19 DIAGNOSIS — Z122 Encounter for screening for malignant neoplasm of respiratory organs: Secondary | ICD-10-CM

## 2023-05-19 DIAGNOSIS — Z87891 Personal history of nicotine dependence: Secondary | ICD-10-CM

## 2023-05-19 DIAGNOSIS — F17210 Nicotine dependence, cigarettes, uncomplicated: Secondary | ICD-10-CM

## 2023-07-29 ENCOUNTER — Ambulatory Visit
Admission: RE | Admit: 2023-07-29 | Discharge: 2023-07-29 | Disposition: A | Discharge status: Home / Self Care | Payer: Medicare Other | Source: Ambulatory Visit | Attending: Acute Care | Admitting: Acute Care

## 2023-07-29 DIAGNOSIS — Z122 Encounter for screening for malignant neoplasm of respiratory organs: Secondary | ICD-10-CM | POA: Diagnosis present

## 2023-07-29 DIAGNOSIS — F17210 Nicotine dependence, cigarettes, uncomplicated: Secondary | ICD-10-CM | POA: Diagnosis present

## 2023-07-29 DIAGNOSIS — Z87891 Personal history of nicotine dependence: Secondary | ICD-10-CM

## 2023-08-05 ENCOUNTER — Other Ambulatory Visit: Payer: Self-pay | Admitting: Acute Care

## 2023-08-05 DIAGNOSIS — F17210 Nicotine dependence, cigarettes, uncomplicated: Secondary | ICD-10-CM

## 2023-08-05 DIAGNOSIS — Z87891 Personal history of nicotine dependence: Secondary | ICD-10-CM

## 2023-08-05 DIAGNOSIS — Z122 Encounter for screening for malignant neoplasm of respiratory organs: Secondary | ICD-10-CM
